# Patient Record
Sex: Male | Born: 1945 | Race: White | Hispanic: No | Marital: Married | Smoking: Never smoker
Health system: Southern US, Community
[De-identification: ages and names within clinical notes are randomized; demographics above are authoritative.]

## PROBLEM LIST (undated history)

## (undated) DIAGNOSIS — I10 Essential (primary) hypertension: Secondary | ICD-10-CM

## (undated) DIAGNOSIS — E785 Hyperlipidemia, unspecified: Secondary | ICD-10-CM

## (undated) HISTORY — DX: Hyperlipidemia, unspecified: E78.5

## (undated) HISTORY — DX: Essential (primary) hypertension: I10

---

## 2000-02-28 ENCOUNTER — Encounter (INDEPENDENT_AMBULATORY_CARE_PROVIDER_SITE_OTHER): Payer: Self-pay | Admitting: *Deleted

## 2000-02-28 ENCOUNTER — Ambulatory Visit (HOSPITAL_COMMUNITY): Admission: RE | Admit: 2000-02-28 | Discharge: 2000-02-28 | Payer: Self-pay | Admitting: Gastroenterology

## 2009-03-29 ENCOUNTER — Ambulatory Visit (HOSPITAL_BASED_OUTPATIENT_CLINIC_OR_DEPARTMENT_OTHER): Admission: RE | Admit: 2009-03-29 | Discharge: 2009-03-29 | Payer: Self-pay | Admitting: Urology

## 2010-10-10 ENCOUNTER — Other Ambulatory Visit: Payer: Self-pay | Admitting: Dermatology

## 2010-12-25 LAB — POCT HEMOGLOBIN-HEMACUE: Hemoglobin: 16.3 g/dL (ref 13.0–17.0)

## 2011-01-31 NOTE — Op Note (Signed)
NAME:  Randy Hudson, Randy Hudson NO.:  [id number]   MEDICAL RECORD NO.:  [id number]          PATIENT TYPE:  AMB   LOCATION:  [facility]                         FACILITY:  [facility]   PHYSICIAN:  Sigmund I. Patsi Sears, M.D.DATE OF BIRTH:  09/01/1946   DATE OF PROCEDURE:  03/29/2009  DATE OF DISCHARGE:                               OPERATIVE REPORT   PREOPERATIVE DIAGNOSIS:  Chronic left hydrocele.   POSTOPERATIVE DIAGNOSIS:  Chronic left hydrocele.   OPERATIONS:  Left hydrocelectomy.   SURGEON:  S. Patsi Sears, M.D.   ANESTHESIA:  General LMA.   Preparation:  After appropriate preanesthesia, the patient is brought to  the operating room, placed on the operating room in dorsal supine  position where general LMA anesthesia was induced.  He remained in this  position, where he received IV antibiotic, as well as a B & O  suppository.  The scrotum was shaved, prepped with Betadine solution and  draped in the usual fashion.   History:  This 65 year old male has a history of increasing left  hydrocele size, which is bothering him walking and causing general  discomfort.  He is now for left hydrocelectomy.   PROCEDURE:  A 5-cm left hemiscrotal incision is made.  Subcutaneous  tissue is dissected with the electrosurgical unit.  A large left  hydrocele is then delivered into the wound, and it is opened, and 125 mL  of clear straw-colored fluid is vacuum suctioned from the hydrocele sac.  The sac is opened, the wings of the sac are removed with the  electrosurgical unit, and each wing is oversewn with 3-0 Vicryl suture.  The two wings of the hydrocele sac are then sutured behind the spermatic  cord with 3-0 Vicryl suture.  No bleeding is noted.  The testicle is  replaced into the scrotum, the wound closed in two layers with running 3-  0 Vicryl suture.  Marcaine 0.25 plain is injected into the wound site,  the patient is given IV Toradol, awakened and taken to the recovery room  in good  condition.      Sigmund I. Patsi Sears, M.D.  Electronically Signed     SIT/MEDQ  D:  03/29/2009  T:  03/29/2009  Job:  [id number]

## 2011-02-03 NOTE — Procedures (Signed)
[facility]  Patient:    Randy Hudson, Randy Hudson                       MRN: [id number] Proc. Date: 02/28/00 Adm. Date:  [id number] Disc. Date: [id number] Attending:  Louie Bun CC:         Winn Jock. Charmian Muff, M.D.                           Procedure Report  PROCEDURE PERFORMED:  Colonoscopy.  ENDOSCOPIST:  Everardo All. Madilyn Fireman, M.D.  INDICATIONS FOR PROCEDURE:  Family history of colon cancer in a first degree relative.  DESCRIPTION OF PROCEDURE:  The patient was placed in the left lateral decubitus position and placed on the pulse monitor and continuous low flow oxygen delivered by nasal cannula.  He was sedated with 70 mg IV Demerol and 5 mg IV Versed.  The Olympus video colonoscope was inserted into the rectum and advanced to the cecum, confirmed by transillumination at McBurneys point and visualization of the ileocecal valve and appendiceal orifice.  The prep was good.  The cecum and ascending colon appeared normal with no masses, polyps, diverticula or other mucosal abnormalities.  Within the transverse colon was seen an 8 mm sessile polyp which was fulgurated by hot biopsy.  The remainder of the transverse, descending, sigmoid and rectum appeared normal with no further polyps, masses, diverticula or other mucosal abnormalities. The colonoscope was then withdrawn and the patient returned to the recovery room in stable condition.  He tolerated the procedure well and there were no immediate complications.  IMPRESSION:  Transverse colon polyp, otherwise normal colonoscopy.  PLAN:  Await histology for determination of interval for next surveillance colonoscopy. DD:  02/28/00 TD:  03/01/00 Job: [id number]

## 2014-05-04 DIAGNOSIS — I10 Essential (primary) hypertension: Secondary | ICD-10-CM | POA: Diagnosis not present

## 2014-05-04 DIAGNOSIS — Z125 Encounter for screening for malignant neoplasm of prostate: Secondary | ICD-10-CM | POA: Diagnosis not present

## 2014-05-04 DIAGNOSIS — E663 Overweight: Secondary | ICD-10-CM | POA: Diagnosis not present

## 2014-05-04 DIAGNOSIS — E7800 Pure hypercholesterolemia, unspecified: Secondary | ICD-10-CM | POA: Diagnosis not present

## 2014-05-04 DIAGNOSIS — M7660 Achilles tendinitis, unspecified leg: Secondary | ICD-10-CM | POA: Diagnosis not present

## 2014-07-01 DIAGNOSIS — Z23 Encounter for immunization: Secondary | ICD-10-CM | POA: Diagnosis not present

## 2014-11-10 DIAGNOSIS — I10 Essential (primary) hypertension: Secondary | ICD-10-CM | POA: Diagnosis not present

## 2014-11-10 DIAGNOSIS — Z800 Family history of malignant neoplasm of digestive organs: Secondary | ICD-10-CM | POA: Diagnosis not present

## 2014-11-10 DIAGNOSIS — Z23 Encounter for immunization: Secondary | ICD-10-CM | POA: Diagnosis not present

## 2014-11-10 DIAGNOSIS — E780 Pure hypercholesterolemia: Secondary | ICD-10-CM | POA: Diagnosis not present

## 2014-11-10 DIAGNOSIS — Z0000 Encounter for general adult medical examination without abnormal findings: Secondary | ICD-10-CM | POA: Diagnosis not present

## 2014-11-10 DIAGNOSIS — E663 Overweight: Secondary | ICD-10-CM | POA: Diagnosis not present

## 2014-11-10 DIAGNOSIS — Z79899 Other long term (current) drug therapy: Secondary | ICD-10-CM | POA: Diagnosis not present

## 2015-05-13 DIAGNOSIS — Z6829 Body mass index (BMI) 29.0-29.9, adult: Secondary | ICD-10-CM | POA: Diagnosis not present

## 2015-05-13 DIAGNOSIS — Z23 Encounter for immunization: Secondary | ICD-10-CM | POA: Diagnosis not present

## 2015-05-13 DIAGNOSIS — Z79899 Other long term (current) drug therapy: Secondary | ICD-10-CM | POA: Diagnosis not present

## 2015-05-13 DIAGNOSIS — I10 Essential (primary) hypertension: Secondary | ICD-10-CM | POA: Diagnosis not present

## 2015-05-13 DIAGNOSIS — E663 Overweight: Secondary | ICD-10-CM | POA: Diagnosis not present

## 2015-05-13 DIAGNOSIS — Z800 Family history of malignant neoplasm of digestive organs: Secondary | ICD-10-CM | POA: Diagnosis not present

## 2015-05-13 DIAGNOSIS — E780 Pure hypercholesterolemia: Secondary | ICD-10-CM | POA: Diagnosis not present

## 2015-08-06 DIAGNOSIS — M6208 Separation of muscle (nontraumatic), other site: Secondary | ICD-10-CM | POA: Diagnosis not present

## 2015-11-26 DIAGNOSIS — Z0000 Encounter for general adult medical examination without abnormal findings: Secondary | ICD-10-CM | POA: Diagnosis not present

## 2015-11-26 DIAGNOSIS — I10 Essential (primary) hypertension: Secondary | ICD-10-CM | POA: Diagnosis not present

## 2015-11-26 DIAGNOSIS — M545 Low back pain: Secondary | ICD-10-CM | POA: Diagnosis not present

## 2015-11-26 DIAGNOSIS — E663 Overweight: Secondary | ICD-10-CM | POA: Diagnosis not present

## 2015-11-26 DIAGNOSIS — E7800 Pure hypercholesterolemia, unspecified: Secondary | ICD-10-CM | POA: Diagnosis not present

## 2015-11-26 DIAGNOSIS — Z209 Contact with and (suspected) exposure to unspecified communicable disease: Secondary | ICD-10-CM | POA: Diagnosis not present

## 2015-11-26 DIAGNOSIS — Z800 Family history of malignant neoplasm of digestive organs: Secondary | ICD-10-CM | POA: Diagnosis not present

## 2015-11-26 DIAGNOSIS — Z125 Encounter for screening for malignant neoplasm of prostate: Secondary | ICD-10-CM | POA: Diagnosis not present

## 2016-05-29 DIAGNOSIS — L570 Actinic keratosis: Secondary | ICD-10-CM | POA: Diagnosis not present

## 2016-05-29 DIAGNOSIS — Z85828 Personal history of other malignant neoplasm of skin: Secondary | ICD-10-CM | POA: Diagnosis not present

## 2016-05-29 DIAGNOSIS — D485 Neoplasm of uncertain behavior of skin: Secondary | ICD-10-CM | POA: Diagnosis not present

## 2016-05-30 DIAGNOSIS — D2239 Melanocytic nevi of other parts of face: Secondary | ICD-10-CM | POA: Diagnosis not present

## 2016-05-30 DIAGNOSIS — L570 Actinic keratosis: Secondary | ICD-10-CM | POA: Diagnosis not present

## 2016-05-30 DIAGNOSIS — C44311 Basal cell carcinoma of skin of nose: Secondary | ICD-10-CM | POA: Diagnosis not present

## 2016-06-05 DIAGNOSIS — Z23 Encounter for immunization: Secondary | ICD-10-CM | POA: Diagnosis not present

## 2016-08-15 DIAGNOSIS — C44311 Basal cell carcinoma of skin of nose: Secondary | ICD-10-CM | POA: Diagnosis not present

## 2016-09-06 DIAGNOSIS — Z86018 Personal history of other benign neoplasm: Secondary | ICD-10-CM | POA: Diagnosis not present

## 2016-09-06 DIAGNOSIS — L821 Other seborrheic keratosis: Secondary | ICD-10-CM | POA: Diagnosis not present

## 2016-09-06 DIAGNOSIS — D225 Melanocytic nevi of trunk: Secondary | ICD-10-CM | POA: Diagnosis not present

## 2016-09-06 DIAGNOSIS — L570 Actinic keratosis: Secondary | ICD-10-CM | POA: Diagnosis not present

## 2016-09-06 DIAGNOSIS — Z85828 Personal history of other malignant neoplasm of skin: Secondary | ICD-10-CM | POA: Diagnosis not present

## 2016-09-06 DIAGNOSIS — Z23 Encounter for immunization: Secondary | ICD-10-CM | POA: Diagnosis not present

## 2016-09-13 DIAGNOSIS — M79605 Pain in left leg: Secondary | ICD-10-CM | POA: Diagnosis not present

## 2016-09-13 DIAGNOSIS — M79604 Pain in right leg: Secondary | ICD-10-CM | POA: Diagnosis not present

## 2016-09-25 DIAGNOSIS — H903 Sensorineural hearing loss, bilateral: Secondary | ICD-10-CM | POA: Diagnosis not present

## 2016-12-06 DIAGNOSIS — H9113 Presbycusis, bilateral: Secondary | ICD-10-CM | POA: Diagnosis not present

## 2016-12-06 DIAGNOSIS — I10 Essential (primary) hypertension: Secondary | ICD-10-CM | POA: Diagnosis not present

## 2016-12-06 DIAGNOSIS — Z6828 Body mass index (BMI) 28.0-28.9, adult: Secondary | ICD-10-CM | POA: Diagnosis not present

## 2016-12-06 DIAGNOSIS — Z0000 Encounter for general adult medical examination without abnormal findings: Secondary | ICD-10-CM | POA: Diagnosis not present

## 2016-12-06 DIAGNOSIS — E7800 Pure hypercholesterolemia, unspecified: Secondary | ICD-10-CM | POA: Diagnosis not present

## 2016-12-06 DIAGNOSIS — E559 Vitamin D deficiency, unspecified: Secondary | ICD-10-CM | POA: Diagnosis not present

## 2016-12-06 DIAGNOSIS — Z800 Family history of malignant neoplasm of digestive organs: Secondary | ICD-10-CM | POA: Diagnosis not present

## 2016-12-06 DIAGNOSIS — E663 Overweight: Secondary | ICD-10-CM | POA: Diagnosis not present

## 2017-06-05 DIAGNOSIS — Z23 Encounter for immunization: Secondary | ICD-10-CM | POA: Diagnosis not present

## 2017-09-04 DIAGNOSIS — D225 Melanocytic nevi of trunk: Secondary | ICD-10-CM | POA: Diagnosis not present

## 2017-09-04 DIAGNOSIS — L821 Other seborrheic keratosis: Secondary | ICD-10-CM | POA: Diagnosis not present

## 2017-09-04 DIAGNOSIS — Z85828 Personal history of other malignant neoplasm of skin: Secondary | ICD-10-CM | POA: Diagnosis not present

## 2017-09-04 DIAGNOSIS — L820 Inflamed seborrheic keratosis: Secondary | ICD-10-CM | POA: Diagnosis not present

## 2017-09-04 DIAGNOSIS — Z23 Encounter for immunization: Secondary | ICD-10-CM | POA: Diagnosis not present

## 2017-09-04 DIAGNOSIS — Z86018 Personal history of other benign neoplasm: Secondary | ICD-10-CM | POA: Diagnosis not present

## 2017-09-04 DIAGNOSIS — L570 Actinic keratosis: Secondary | ICD-10-CM | POA: Diagnosis not present

## 2017-10-09 DIAGNOSIS — H35363 Drusen (degenerative) of macula, bilateral: Secondary | ICD-10-CM | POA: Diagnosis not present

## 2017-11-30 DIAGNOSIS — H35363 Drusen (degenerative) of macula, bilateral: Secondary | ICD-10-CM | POA: Diagnosis not present

## 2017-12-11 DIAGNOSIS — H9113 Presbycusis, bilateral: Secondary | ICD-10-CM | POA: Diagnosis not present

## 2017-12-11 DIAGNOSIS — Z0000 Encounter for general adult medical examination without abnormal findings: Secondary | ICD-10-CM | POA: Diagnosis not present

## 2017-12-11 DIAGNOSIS — E7800 Pure hypercholesterolemia, unspecified: Secondary | ICD-10-CM | POA: Diagnosis not present

## 2017-12-11 DIAGNOSIS — Z800 Family history of malignant neoplasm of digestive organs: Secondary | ICD-10-CM | POA: Diagnosis not present

## 2017-12-11 DIAGNOSIS — Z125 Encounter for screening for malignant neoplasm of prostate: Secondary | ICD-10-CM | POA: Diagnosis not present

## 2017-12-11 DIAGNOSIS — Z0001 Encounter for general adult medical examination with abnormal findings: Secondary | ICD-10-CM | POA: Diagnosis not present

## 2017-12-11 DIAGNOSIS — I10 Essential (primary) hypertension: Secondary | ICD-10-CM | POA: Diagnosis not present

## 2017-12-11 DIAGNOSIS — E663 Overweight: Secondary | ICD-10-CM | POA: Diagnosis not present

## 2017-12-11 DIAGNOSIS — Z1389 Encounter for screening for other disorder: Secondary | ICD-10-CM | POA: Diagnosis not present

## 2017-12-11 DIAGNOSIS — E559 Vitamin D deficiency, unspecified: Secondary | ICD-10-CM | POA: Diagnosis not present

## 2017-12-26 DIAGNOSIS — H35351 Cystoid macular degeneration, right eye: Secondary | ICD-10-CM | POA: Diagnosis not present

## 2018-01-21 DIAGNOSIS — Z0000 Encounter for general adult medical examination without abnormal findings: Secondary | ICD-10-CM | POA: Diagnosis not present

## 2018-01-21 DIAGNOSIS — R972 Elevated prostate specific antigen [PSA]: Secondary | ICD-10-CM | POA: Diagnosis not present

## 2018-01-21 DIAGNOSIS — E7800 Pure hypercholesterolemia, unspecified: Secondary | ICD-10-CM | POA: Diagnosis not present

## 2018-01-29 DIAGNOSIS — L300 Nummular dermatitis: Secondary | ICD-10-CM | POA: Diagnosis not present

## 2018-02-06 DIAGNOSIS — H35363 Drusen (degenerative) of macula, bilateral: Secondary | ICD-10-CM | POA: Diagnosis not present

## 2018-02-06 DIAGNOSIS — H35351 Cystoid macular degeneration, right eye: Secondary | ICD-10-CM | POA: Diagnosis not present

## 2018-02-14 DIAGNOSIS — E7800 Pure hypercholesterolemia, unspecified: Secondary | ICD-10-CM | POA: Diagnosis not present

## 2018-02-14 DIAGNOSIS — Z0000 Encounter for general adult medical examination without abnormal findings: Secondary | ICD-10-CM | POA: Diagnosis not present

## 2018-02-14 DIAGNOSIS — R972 Elevated prostate specific antigen [PSA]: Secondary | ICD-10-CM | POA: Diagnosis not present

## 2018-03-05 DIAGNOSIS — H34831 Tributary (branch) retinal vein occlusion, right eye, with macular edema: Secondary | ICD-10-CM | POA: Diagnosis not present

## 2018-03-05 DIAGNOSIS — H3561 Retinal hemorrhage, right eye: Secondary | ICD-10-CM | POA: Diagnosis not present

## 2018-03-05 DIAGNOSIS — H353122 Nonexudative age-related macular degeneration, left eye, intermediate dry stage: Secondary | ICD-10-CM | POA: Diagnosis not present

## 2018-03-05 DIAGNOSIS — H353111 Nonexudative age-related macular degeneration, right eye, early dry stage: Secondary | ICD-10-CM | POA: Diagnosis not present

## 2018-03-26 DIAGNOSIS — H34831 Tributary (branch) retinal vein occlusion, right eye, with macular edema: Secondary | ICD-10-CM | POA: Diagnosis not present

## 2018-04-30 DIAGNOSIS — H353122 Nonexudative age-related macular degeneration, left eye, intermediate dry stage: Secondary | ICD-10-CM | POA: Diagnosis not present

## 2018-04-30 DIAGNOSIS — H353111 Nonexudative age-related macular degeneration, right eye, early dry stage: Secondary | ICD-10-CM | POA: Diagnosis not present

## 2018-04-30 DIAGNOSIS — H33193 Other retinoschisis and retinal cysts, bilateral: Secondary | ICD-10-CM | POA: Diagnosis not present

## 2018-04-30 DIAGNOSIS — H34831 Tributary (branch) retinal vein occlusion, right eye, with macular edema: Secondary | ICD-10-CM | POA: Diagnosis not present

## 2018-06-04 DIAGNOSIS — H353111 Nonexudative age-related macular degeneration, right eye, early dry stage: Secondary | ICD-10-CM | POA: Diagnosis not present

## 2018-06-04 DIAGNOSIS — H34831 Tributary (branch) retinal vein occlusion, right eye, with macular edema: Secondary | ICD-10-CM | POA: Diagnosis not present

## 2018-06-04 DIAGNOSIS — H33193 Other retinoschisis and retinal cysts, bilateral: Secondary | ICD-10-CM | POA: Diagnosis not present

## 2018-06-04 DIAGNOSIS — H353122 Nonexudative age-related macular degeneration, left eye, intermediate dry stage: Secondary | ICD-10-CM | POA: Diagnosis not present

## 2018-06-18 DIAGNOSIS — Z23 Encounter for immunization: Secondary | ICD-10-CM | POA: Diagnosis not present

## 2018-07-16 DIAGNOSIS — H33193 Other retinoschisis and retinal cysts, bilateral: Secondary | ICD-10-CM | POA: Diagnosis not present

## 2018-07-16 DIAGNOSIS — H34831 Tributary (branch) retinal vein occlusion, right eye, with macular edema: Secondary | ICD-10-CM | POA: Diagnosis not present

## 2018-07-16 DIAGNOSIS — H353122 Nonexudative age-related macular degeneration, left eye, intermediate dry stage: Secondary | ICD-10-CM | POA: Diagnosis not present

## 2018-07-16 DIAGNOSIS — H353111 Nonexudative age-related macular degeneration, right eye, early dry stage: Secondary | ICD-10-CM | POA: Diagnosis not present

## 2018-09-04 DIAGNOSIS — Z85828 Personal history of other malignant neoplasm of skin: Secondary | ICD-10-CM | POA: Diagnosis not present

## 2018-09-04 DIAGNOSIS — L723 Sebaceous cyst: Secondary | ICD-10-CM | POA: Diagnosis not present

## 2018-09-04 DIAGNOSIS — L821 Other seborrheic keratosis: Secondary | ICD-10-CM | POA: Diagnosis not present

## 2018-09-04 DIAGNOSIS — L739 Follicular disorder, unspecified: Secondary | ICD-10-CM | POA: Diagnosis not present

## 2018-09-04 DIAGNOSIS — D225 Melanocytic nevi of trunk: Secondary | ICD-10-CM | POA: Diagnosis not present

## 2018-09-04 DIAGNOSIS — Z86018 Personal history of other benign neoplasm: Secondary | ICD-10-CM | POA: Diagnosis not present

## 2018-09-04 DIAGNOSIS — Z23 Encounter for immunization: Secondary | ICD-10-CM | POA: Diagnosis not present

## 2018-09-04 DIAGNOSIS — L570 Actinic keratosis: Secondary | ICD-10-CM | POA: Diagnosis not present

## 2018-09-20 DIAGNOSIS — H353122 Nonexudative age-related macular degeneration, left eye, intermediate dry stage: Secondary | ICD-10-CM | POA: Diagnosis not present

## 2018-09-20 DIAGNOSIS — H34831 Tributary (branch) retinal vein occlusion, right eye, with macular edema: Secondary | ICD-10-CM | POA: Diagnosis not present

## 2018-09-20 DIAGNOSIS — H33193 Other retinoschisis and retinal cysts, bilateral: Secondary | ICD-10-CM | POA: Diagnosis not present

## 2018-09-20 DIAGNOSIS — H353111 Nonexudative age-related macular degeneration, right eye, early dry stage: Secondary | ICD-10-CM | POA: Diagnosis not present

## 2018-11-06 DIAGNOSIS — H35033 Hypertensive retinopathy, bilateral: Secondary | ICD-10-CM | POA: Diagnosis not present

## 2018-11-06 DIAGNOSIS — H35363 Drusen (degenerative) of macula, bilateral: Secondary | ICD-10-CM | POA: Diagnosis not present

## 2018-11-29 DIAGNOSIS — Z86010 Personal history of colonic polyps: Secondary | ICD-10-CM | POA: Diagnosis not present

## 2018-11-29 DIAGNOSIS — D123 Benign neoplasm of transverse colon: Secondary | ICD-10-CM | POA: Diagnosis not present

## 2018-11-29 DIAGNOSIS — D125 Benign neoplasm of sigmoid colon: Secondary | ICD-10-CM | POA: Diagnosis not present

## 2018-11-29 DIAGNOSIS — Z800 Family history of malignant neoplasm of digestive organs: Secondary | ICD-10-CM | POA: Diagnosis not present

## 2018-11-29 DIAGNOSIS — K5730 Diverticulosis of large intestine without perforation or abscess without bleeding: Secondary | ICD-10-CM | POA: Diagnosis not present

## 2018-11-29 DIAGNOSIS — K621 Rectal polyp: Secondary | ICD-10-CM | POA: Diagnosis not present

## 2018-12-03 DIAGNOSIS — D125 Benign neoplasm of sigmoid colon: Secondary | ICD-10-CM | POA: Diagnosis not present

## 2018-12-03 DIAGNOSIS — D123 Benign neoplasm of transverse colon: Secondary | ICD-10-CM | POA: Diagnosis not present

## 2018-12-03 DIAGNOSIS — K621 Rectal polyp: Secondary | ICD-10-CM | POA: Diagnosis not present

## 2018-12-11 DIAGNOSIS — H34831 Tributary (branch) retinal vein occlusion, right eye, with macular edema: Secondary | ICD-10-CM | POA: Diagnosis not present

## 2018-12-31 DIAGNOSIS — E7800 Pure hypercholesterolemia, unspecified: Secondary | ICD-10-CM | POA: Diagnosis not present

## 2018-12-31 DIAGNOSIS — I10 Essential (primary) hypertension: Secondary | ICD-10-CM | POA: Diagnosis not present

## 2018-12-31 DIAGNOSIS — Z0000 Encounter for general adult medical examination without abnormal findings: Secondary | ICD-10-CM | POA: Diagnosis not present

## 2018-12-31 DIAGNOSIS — H9113 Presbycusis, bilateral: Secondary | ICD-10-CM | POA: Diagnosis not present

## 2018-12-31 DIAGNOSIS — Z86010 Personal history of colonic polyps: Secondary | ICD-10-CM | POA: Diagnosis not present

## 2018-12-31 DIAGNOSIS — E559 Vitamin D deficiency, unspecified: Secondary | ICD-10-CM | POA: Diagnosis not present

## 2019-02-28 DIAGNOSIS — H34831 Tributary (branch) retinal vein occlusion, right eye, with macular edema: Secondary | ICD-10-CM | POA: Diagnosis not present

## 2019-05-21 DIAGNOSIS — H33193 Other retinoschisis and retinal cysts, bilateral: Secondary | ICD-10-CM | POA: Diagnosis not present

## 2019-05-21 DIAGNOSIS — H34831 Tributary (branch) retinal vein occlusion, right eye, with macular edema: Secondary | ICD-10-CM | POA: Diagnosis not present

## 2019-05-21 DIAGNOSIS — H353111 Nonexudative age-related macular degeneration, right eye, early dry stage: Secondary | ICD-10-CM | POA: Diagnosis not present

## 2019-05-21 DIAGNOSIS — H353122 Nonexudative age-related macular degeneration, left eye, intermediate dry stage: Secondary | ICD-10-CM | POA: Diagnosis not present

## 2019-05-27 DIAGNOSIS — E559 Vitamin D deficiency, unspecified: Secondary | ICD-10-CM | POA: Diagnosis not present

## 2019-05-27 DIAGNOSIS — M7662 Achilles tendinitis, left leg: Secondary | ICD-10-CM | POA: Diagnosis not present

## 2019-05-27 DIAGNOSIS — I10 Essential (primary) hypertension: Secondary | ICD-10-CM | POA: Diagnosis not present

## 2019-05-27 DIAGNOSIS — Z86010 Personal history of colonic polyps: Secondary | ICD-10-CM | POA: Diagnosis not present

## 2019-05-27 DIAGNOSIS — M7661 Achilles tendinitis, right leg: Secondary | ICD-10-CM | POA: Diagnosis not present

## 2019-05-27 DIAGNOSIS — E663 Overweight: Secondary | ICD-10-CM | POA: Diagnosis not present

## 2019-05-27 DIAGNOSIS — Z6828 Body mass index (BMI) 28.0-28.9, adult: Secondary | ICD-10-CM | POA: Diagnosis not present

## 2019-05-27 DIAGNOSIS — E7800 Pure hypercholesterolemia, unspecified: Secondary | ICD-10-CM | POA: Diagnosis not present

## 2019-05-27 DIAGNOSIS — Z23 Encounter for immunization: Secondary | ICD-10-CM | POA: Diagnosis not present

## 2019-06-20 DIAGNOSIS — C44619 Basal cell carcinoma of skin of left upper limb, including shoulder: Secondary | ICD-10-CM | POA: Diagnosis not present

## 2019-06-20 DIAGNOSIS — D485 Neoplasm of uncertain behavior of skin: Secondary | ICD-10-CM | POA: Diagnosis not present

## 2019-06-20 DIAGNOSIS — Z23 Encounter for immunization: Secondary | ICD-10-CM | POA: Diagnosis not present

## 2019-08-07 DIAGNOSIS — C44619 Basal cell carcinoma of skin of left upper limb, including shoulder: Secondary | ICD-10-CM | POA: Diagnosis not present

## 2019-08-27 DIAGNOSIS — H348312 Tributary (branch) retinal vein occlusion, right eye, stable: Secondary | ICD-10-CM | POA: Diagnosis not present

## 2019-08-27 DIAGNOSIS — H353122 Nonexudative age-related macular degeneration, left eye, intermediate dry stage: Secondary | ICD-10-CM | POA: Diagnosis not present

## 2019-08-27 DIAGNOSIS — H353111 Nonexudative age-related macular degeneration, right eye, early dry stage: Secondary | ICD-10-CM | POA: Diagnosis not present

## 2019-08-27 DIAGNOSIS — H33193 Other retinoschisis and retinal cysts, bilateral: Secondary | ICD-10-CM | POA: Diagnosis not present

## 2019-09-24 DIAGNOSIS — H33193 Other retinoschisis and retinal cysts, bilateral: Secondary | ICD-10-CM | POA: Diagnosis not present

## 2019-09-24 DIAGNOSIS — H353122 Nonexudative age-related macular degeneration, left eye, intermediate dry stage: Secondary | ICD-10-CM | POA: Diagnosis not present

## 2019-09-24 DIAGNOSIS — H34831 Tributary (branch) retinal vein occlusion, right eye, with macular edema: Secondary | ICD-10-CM | POA: Diagnosis not present

## 2019-09-24 DIAGNOSIS — H353111 Nonexudative age-related macular degeneration, right eye, early dry stage: Secondary | ICD-10-CM | POA: Diagnosis not present

## 2019-10-17 ENCOUNTER — Ambulatory Visit: Payer: Self-pay

## 2019-10-25 ENCOUNTER — Ambulatory Visit: Payer: Self-pay

## 2019-11-07 ENCOUNTER — Ambulatory Visit: Payer: Self-pay

## 2019-11-19 DIAGNOSIS — H353111 Nonexudative age-related macular degeneration, right eye, early dry stage: Secondary | ICD-10-CM | POA: Diagnosis not present

## 2019-11-19 DIAGNOSIS — H353122 Nonexudative age-related macular degeneration, left eye, intermediate dry stage: Secondary | ICD-10-CM | POA: Diagnosis not present

## 2019-11-19 DIAGNOSIS — H34831 Tributary (branch) retinal vein occlusion, right eye, with macular edema: Secondary | ICD-10-CM | POA: Diagnosis not present

## 2020-01-06 DIAGNOSIS — Z6829 Body mass index (BMI) 29.0-29.9, adult: Secondary | ICD-10-CM | POA: Diagnosis not present

## 2020-01-06 DIAGNOSIS — E663 Overweight: Secondary | ICD-10-CM | POA: Diagnosis not present

## 2020-01-06 DIAGNOSIS — E7800 Pure hypercholesterolemia, unspecified: Secondary | ICD-10-CM | POA: Diagnosis not present

## 2020-01-06 DIAGNOSIS — I10 Essential (primary) hypertension: Secondary | ICD-10-CM | POA: Diagnosis not present

## 2020-01-06 DIAGNOSIS — Z86010 Personal history of colonic polyps: Secondary | ICD-10-CM | POA: Diagnosis not present

## 2020-01-06 DIAGNOSIS — E559 Vitamin D deficiency, unspecified: Secondary | ICD-10-CM | POA: Diagnosis not present

## 2020-01-06 DIAGNOSIS — Z800 Family history of malignant neoplasm of digestive organs: Secondary | ICD-10-CM | POA: Diagnosis not present

## 2020-01-06 DIAGNOSIS — H9113 Presbycusis, bilateral: Secondary | ICD-10-CM | POA: Diagnosis not present

## 2020-01-06 DIAGNOSIS — Z125 Encounter for screening for malignant neoplasm of prostate: Secondary | ICD-10-CM | POA: Diagnosis not present

## 2020-01-06 DIAGNOSIS — Z0000 Encounter for general adult medical examination without abnormal findings: Secondary | ICD-10-CM | POA: Diagnosis not present

## 2020-01-14 DIAGNOSIS — H353122 Nonexudative age-related macular degeneration, left eye, intermediate dry stage: Secondary | ICD-10-CM | POA: Diagnosis not present

## 2020-01-14 DIAGNOSIS — H353111 Nonexudative age-related macular degeneration, right eye, early dry stage: Secondary | ICD-10-CM | POA: Diagnosis not present

## 2020-01-14 DIAGNOSIS — H33193 Other retinoschisis and retinal cysts, bilateral: Secondary | ICD-10-CM | POA: Diagnosis not present

## 2020-01-14 DIAGNOSIS — H34831 Tributary (branch) retinal vein occlusion, right eye, with macular edema: Secondary | ICD-10-CM | POA: Diagnosis not present

## 2020-05-26 DIAGNOSIS — H353122 Nonexudative age-related macular degeneration, left eye, intermediate dry stage: Secondary | ICD-10-CM | POA: Diagnosis not present

## 2020-05-26 DIAGNOSIS — H353111 Nonexudative age-related macular degeneration, right eye, early dry stage: Secondary | ICD-10-CM | POA: Diagnosis not present

## 2020-05-26 DIAGNOSIS — H34831 Tributary (branch) retinal vein occlusion, right eye, with macular edema: Secondary | ICD-10-CM | POA: Diagnosis not present

## 2020-05-26 DIAGNOSIS — H33193 Other retinoschisis and retinal cysts, bilateral: Secondary | ICD-10-CM | POA: Diagnosis not present

## 2020-05-31 DIAGNOSIS — Z23 Encounter for immunization: Secondary | ICD-10-CM | POA: Diagnosis not present

## 2020-07-05 DIAGNOSIS — I10 Essential (primary) hypertension: Secondary | ICD-10-CM | POA: Diagnosis not present

## 2020-07-05 DIAGNOSIS — E663 Overweight: Secondary | ICD-10-CM | POA: Diagnosis not present

## 2020-07-05 DIAGNOSIS — Z6828 Body mass index (BMI) 28.0-28.9, adult: Secondary | ICD-10-CM | POA: Diagnosis not present

## 2020-07-05 DIAGNOSIS — E7800 Pure hypercholesterolemia, unspecified: Secondary | ICD-10-CM | POA: Diagnosis not present

## 2020-07-16 DIAGNOSIS — Z23 Encounter for immunization: Secondary | ICD-10-CM | POA: Diagnosis not present

## 2020-08-18 DIAGNOSIS — H3561 Retinal hemorrhage, right eye: Secondary | ICD-10-CM | POA: Diagnosis not present

## 2020-08-18 DIAGNOSIS — H353122 Nonexudative age-related macular degeneration, left eye, intermediate dry stage: Secondary | ICD-10-CM | POA: Diagnosis not present

## 2020-08-18 DIAGNOSIS — H353111 Nonexudative age-related macular degeneration, right eye, early dry stage: Secondary | ICD-10-CM | POA: Diagnosis not present

## 2020-08-18 DIAGNOSIS — H34831 Tributary (branch) retinal vein occlusion, right eye, with macular edema: Secondary | ICD-10-CM | POA: Diagnosis not present

## 2020-10-18 DIAGNOSIS — R002 Palpitations: Secondary | ICD-10-CM | POA: Diagnosis not present

## 2020-10-18 DIAGNOSIS — R0789 Other chest pain: Secondary | ICD-10-CM | POA: Diagnosis not present

## 2020-12-21 DIAGNOSIS — H353122 Nonexudative age-related macular degeneration, left eye, intermediate dry stage: Secondary | ICD-10-CM | POA: Diagnosis not present

## 2020-12-21 DIAGNOSIS — H3561 Retinal hemorrhage, right eye: Secondary | ICD-10-CM | POA: Diagnosis not present

## 2020-12-21 DIAGNOSIS — H353111 Nonexudative age-related macular degeneration, right eye, early dry stage: Secondary | ICD-10-CM | POA: Diagnosis not present

## 2020-12-21 DIAGNOSIS — H34831 Tributary (branch) retinal vein occlusion, right eye, with macular edema: Secondary | ICD-10-CM | POA: Diagnosis not present

## 2020-12-28 DIAGNOSIS — H34831 Tributary (branch) retinal vein occlusion, right eye, with macular edema: Secondary | ICD-10-CM | POA: Diagnosis not present

## 2021-01-05 DIAGNOSIS — E7800 Pure hypercholesterolemia, unspecified: Secondary | ICD-10-CM | POA: Diagnosis not present

## 2021-01-05 DIAGNOSIS — Z6829 Body mass index (BMI) 29.0-29.9, adult: Secondary | ICD-10-CM | POA: Diagnosis not present

## 2021-01-05 DIAGNOSIS — R002 Palpitations: Secondary | ICD-10-CM | POA: Diagnosis not present

## 2021-01-05 DIAGNOSIS — I10 Essential (primary) hypertension: Secondary | ICD-10-CM | POA: Diagnosis not present

## 2021-01-11 DIAGNOSIS — Z1389 Encounter for screening for other disorder: Secondary | ICD-10-CM | POA: Diagnosis not present

## 2021-01-11 DIAGNOSIS — Z0000 Encounter for general adult medical examination without abnormal findings: Secondary | ICD-10-CM | POA: Diagnosis not present

## 2021-01-23 DIAGNOSIS — Z23 Encounter for immunization: Secondary | ICD-10-CM | POA: Diagnosis not present

## 2021-03-23 ENCOUNTER — Encounter: Payer: Self-pay | Admitting: Interventional Cardiology

## 2021-03-23 ENCOUNTER — Other Ambulatory Visit: Payer: Self-pay

## 2021-03-23 ENCOUNTER — Telehealth: Payer: Self-pay | Admitting: *Deleted

## 2021-03-23 ENCOUNTER — Ambulatory Visit (INDEPENDENT_AMBULATORY_CARE_PROVIDER_SITE_OTHER): Payer: Medicare Other | Admitting: Interventional Cardiology

## 2021-03-23 VITALS — BP 150/88 | HR 88 | Ht 65.0 in | Wt 177.2 lb

## 2021-03-23 DIAGNOSIS — R072 Precordial pain: Secondary | ICD-10-CM

## 2021-03-23 DIAGNOSIS — I10 Essential (primary) hypertension: Secondary | ICD-10-CM | POA: Diagnosis not present

## 2021-03-23 DIAGNOSIS — R002 Palpitations: Secondary | ICD-10-CM | POA: Diagnosis not present

## 2021-03-23 DIAGNOSIS — R0683 Snoring: Secondary | ICD-10-CM

## 2021-03-23 DIAGNOSIS — Z8249 Family history of ischemic heart disease and other diseases of the circulatory system: Secondary | ICD-10-CM | POA: Diagnosis not present

## 2021-03-23 DIAGNOSIS — E782 Mixed hyperlipidemia: Secondary | ICD-10-CM

## 2021-03-23 DIAGNOSIS — G4730 Sleep apnea, unspecified: Secondary | ICD-10-CM

## 2021-03-23 LAB — BASIC METABOLIC PANEL
BUN/Creatinine Ratio: 12 (ref 10–24)
BUN: 13 mg/dL (ref 8–27)
CO2: 24 mmol/L (ref 20–29)
Calcium: 9.6 mg/dL (ref 8.6–10.2)
Chloride: 102 mmol/L (ref 96–106)
Creatinine, Ser: 1.05 mg/dL (ref 0.76–1.27)
Glucose: 93 mg/dL (ref 65–99)
Potassium: 4.1 mmol/L (ref 3.5–5.2)
Sodium: 140 mmol/L (ref 134–144)
eGFR: 75 mL/min/{1.73_m2} (ref >59)

## 2021-03-23 MED ORDER — METOPROLOL TARTRATE 100 MG PO TABS: ORAL_TABLET | ORAL | 0 refills | Status: DC

## 2021-03-23 NOTE — Telephone Encounter (Signed)
-----   Message from Lauralee Evener, [place] sent at 03/23/2021 10:52 AM EDT ----- Regarding: FW: sleep study Ok o schedule sleep study. No PA is required for Medicare. ----- Message ----- From: Thompson Grayer, RN Sent: 03/23/2021  10:11 AM EDT To: Lauralee Evener, CMA, Freada Bergeron, CMA, # Subject: sleep study                                    Dr Irish Lack has ordered a sleep study for this patient.  Sleep study to be done after patient has Cardiac CT scan. Thanks, Fraser Din

## 2021-03-23 NOTE — H&P (View-Only) (Signed)
Cardiology Office Note   Date:  03/23/2021   ID:  Randy Hudson, DOB [birth year], MRN [id number]  PCP:  Pcp, No    No chief complaint on file.  palpitations  Wt Readings from Last 3 Encounters:  03/23/21 177 lb 3.2 oz (80.4 kg)       History of Present Illness: Randy Hudson is a 75 y.o. male who is being seen today for the evaluation of palpitations, FH early CAD at the request of Kathyrn Lass, MD.   Son had CABG 2 months ago at [facility]. Father had AVR.  Older brother had CABG at age 73. THey were all smokers.  Patient does not smoke.  Denies : Dizziness. Leg edema. Nitroglycerin use. Orthopnea. Paroxysmal nocturnal dyspnea. Shortness of breath. Syncope.    Walks, does yard work, shovels mulch, shoveled snow- no cardiac sx.  Does not walk a lot.  Chest pains-some sharp and quick; sometimes dull and lasting a few mintues. Palpitations worse if he lies on his left side- interrupts sleep.  BP higher at the MDs office.  Worked at [employer] as an Chief Financial Officer.    No past medical history on file.     Current Outpatient Medications  Medication Sig Dispense Refill   atorvastatin (LIPITOR) 80 MG tablet Take 80 mg by mouth daily.     cholecalciferol (VITAMIN D3) 25 MCG (1000 UNIT) tablet Take 2,000 Units by mouth daily.     Multiple Vitamins-Minerals (PRESERVISION AREDS 2+MULTI VIT PO) Take by mouth.     tobramycin (TOBREX) 0.3 % ophthalmic solution PLEASE SEE ATTACHED FOR DETAILED DIRECTIONS     triamterene-hydrochlorothiazide (DYAZIDE) 37.5-25 MG capsule Take 1 capsule by mouth every morning.     No current facility-administered medications for this visit.    Allergies:   Patient has no allergy information on record.    Social History:  The patient  reports that he has never smoked. He has never used smokeless tobacco.   Family History:  The patient's family history includes Colon cancer in his father; Heart disease in his father.    ROS:  Please see the history of  present illness.   Otherwise, review of systems are positive for palpitations, macular edema- gets injections.   All other systems are reviewed and negative.    PHYSICAL EXAM: VS:  BP (!) 150/88   Pulse 88   Ht 5\' 5"  (1.651 m)   Wt 177 lb 3.2 oz (80.4 kg)   SpO2 98%   BMI 29.49 kg/m  , BMI Body mass index is 29.49 kg/m. GEN: Well nourished, well developed, in no acute distress HEENT: normal Neck: no JVD, carotid bruits, or masses Cardiac: RRR; no murmurs, rubs, or gallops,no edema  Respiratory:  clear to auscultation bilaterally, normal work of breathing GI: soft, nontender, nondistended, + BS MS: no deformity or atrophy Skin: warm and dry, no rash Neuro:  Strength and sensation are intact Psych: euthymic mood, full affect   EKG:   The ekg ordered today demonstrates NSR, no ST changes   Recent Labs: No results found for requested labs within last 8760 hours.   Lipid Panel No results found for: CHOL, TRIG, HDL, CHOLHDL, VLDL, LDLCALC, LDLDIRECT   Other studies Reviewed: Additional studies/ records that were reviewed today with results demonstrating: LDL 133 in 4/22.   ASSESSMENT AND PLAN:  Chest pain: Worse in certain position.  Has had some DOE as well.  Will pan to eval for ischemia. Plan for CAT coronaries. HTN: High  readings in the MDs office.  120/80 at home.  Low-salt diet.  Avoid processed foods. Palpitations: He has an apple watch.  He will obtain strips from the apple watch and send them to [place].  No problems with palpitations during the day.  No syncope.  I think this is likely low risk. Hyperlipidemia: If calcium score is elevated, will likely need to intensify his statin and try to get LDL around 70. Family h/o CAD: Multiple people in the family with history of bypass.  They have been smokers.  He has not been. Observed apnea: Wife is noted that he stops breathing during sleep.  We will plan for sleep study after CTA coronaries.   Current medicines are  reviewed at length with the patient today.  The patient concerns regarding his medicines were addressed.  The following changes have been made:  No change  Labs/ tests ordered today include:  No orders of the defined types were placed in this encounter.   Recommend 150 minutes/week of aerobic exercise Low fat, low carb, high fiber diet recommended  Disposition:   FU in for CTA   Signed, [name], MD  03/23/2021 9:26 AM    [address], [place], [place]  [address] Phone: [phone]; Fax: [phone]

## 2021-03-23 NOTE — Patient Instructions (Addendum)
Medication Instructions:  Your physician recommends that you continue on your current medications as directed. Please refer to the Current Medication list given to you today.  *If you need a refill on your cardiac medications before your next appointment, please call your pharmacy*   Lab Work: Lab work to be done today--BMP  If you have labs (blood work) drawn today and your tests are completely normal, you will receive your results only by: [place] (if you have MyChart) OR A paper copy in the mail If you have any lab test that is abnormal or we need to change your treatment, we will call you to review the results.   Testing/Procedures: Your physician has requested that you have cardiac CT. Cardiac computed tomography (CT) is a painless test that uses an x-ray machine to take clear, detailed pictures of your heart. For further information please visit [email or web address]. Please follow instruction sheet as given.  Your physician has recommended that you have a sleep study. This test records several body functions during sleep, including: brain activity, eye movement, oxygen and carbon dioxide blood levels, heart rate and rhythm, breathing rate and rhythm, the flow of air through your mouth and nose, snoring, body muscle movements, and chest and belly movement. To be done after Cardiac CT Scan   Follow-Up: At [facility], you and your health needs are our priority.  As part of our continuing mission to provide you with exceptional heart care, we have created designated Provider Care Teams.  These Care Teams include your primary Cardiologist (physician) and Advanced Practice Providers (APPs -  Physician Assistants and Nurse Practitioners) who all work together to provide you with the care you need, when you need it.  We recommend signing up for the patient portal called "MyChart".  Sign up information is provided on this After Visit Summary.  MyChart is used to connect with patients  for Virtual Visits (Telemedicine).  Patients are able to view lab/test results, encounter notes, upcoming appointments, etc.  Non-urgent messages can be sent to your provider as well.   To learn more about what you can do with MyChart, go to [email or web address].    Your next appointment:   12 month(s)  The format for your next appointment:   In Person  Provider:   You may see [name], MD or one of the following Advanced Practice Providers on your designated Care Team:   Melina Copa, PA-C Ermalinda Barrios, PA-C   Other Instructions  If having palpitations use watch to record and send in through my chart    Your cardiac CT will be scheduled at one of the below locations:   [facility] [address] [place], [place] [address] [phone]  [place] [address] [place], [place] [address] [phone]  If scheduled at [facility], please arrive at the [facility] main entrance (entrance A) of [facility] 30 minutes prior to test start time. Proceed to the [facility] Radiology Department (first floor) to check-in and test prep.  If scheduled at [facility], please arrive 15 mins early for check-in and test prep.  Please follow these instructions carefully (unless otherwise directed):  Hold all erectile dysfunction medications at least 3 days (72 hrs) prior to test.  On the Night Before the Test: Be sure to Drink plenty of water. Do not consume any caffeinated/decaffeinated beverages or chocolate 12 hours prior to your test. Do not take any  antihistamines 12 hours prior to your test.   On the Day of the Test: Drink plenty of water until 1 hour prior to the test. Do not eat any food 4 hours prior to the test. You may take your regular medications prior to the test.  Take metoprolol (Lopressor) two hours prior to test. Dose is 100 mg HOLD  Furosemide/Hydrochlorothiazide morning of the test.    After the Test: Drink plenty of water. After receiving IV contrast, you may experience a mild flushed feeling. This is normal. On occasion, you may experience a mild rash up to 24 hours after the test. This is not dangerous. If this occurs, you can take Benadryl 25 mg and increase your fluid intake. If you experience trouble breathing, this can be serious. If it is severe call 911 IMMEDIATELY. If it is mild, please call our office. If you take any of these medications: Glipizide/Metformin, Avandament, Glucavance, please do not take 48 hours after completing test unless otherwise instructed.   Once we have confirmed authorization from your insurance company, we will call you to set up a date and time for your test. Based on how quickly your insurance processes prior authorizations requests, please allow up to 4 weeks to be contacted for scheduling your Cardiac CT appointment. Be advised that routine Cardiac CT appointments could be scheduled as many as 8 weeks after your provider has ordered it.  For non-scheduling related questions, please contact the cardiac imaging nurse navigator should you have any questions/concerns: Marchia Bond, Cardiac Imaging Nurse Navigator Gordy Clement, Cardiac Imaging Nurse Navigator [facility] Direct Office Dial: [phone]   For scheduling needs, including cancellations and rescheduling, please call [place], [phone].

## 2021-03-23 NOTE — Progress Notes (Signed)
Cardiology Office Note   Date:  03/23/2021   ID:  ECHO ALLSBROOK, DOB [birth year], MRN [id number]  PCP:  Pcp, No    No chief complaint on file.  palpitations  Wt Readings from Last 3 Encounters:  03/23/21 177 lb 3.2 oz (80.4 kg)       History of Present Illness: Randy Hudson is a 75 y.o. male who is being seen today for the evaluation of palpitations, FH early CAD at the request of Kathyrn Lass, MD.   Son had CABG 2 months ago at [facility]. Father had AVR.  Older brother had CABG at age 16. THey were all smokers.  Patient does not smoke.  Denies : Dizziness. Leg edema. Nitroglycerin use. Orthopnea. Paroxysmal nocturnal dyspnea. Shortness of breath. Syncope.    Walks, does yard work, shovels mulch, shoveled snow- no cardiac sx.  Does not walk a lot.  Chest pains-some sharp and quick; sometimes dull and lasting a few mintues. Palpitations worse if he lies on his left side- interrupts sleep.  BP higher at the MDs office.  Worked at [employer] as an Chief Financial Officer.    No past medical history on file.     Current Outpatient Medications  Medication Sig Dispense Refill   atorvastatin (LIPITOR) 80 MG tablet Take 80 mg by mouth daily.     cholecalciferol (VITAMIN D3) 25 MCG (1000 UNIT) tablet Take 2,000 Units by mouth daily.     Multiple Vitamins-Minerals (PRESERVISION AREDS 2+MULTI VIT PO) Take by mouth.     tobramycin (TOBREX) 0.3 % ophthalmic solution PLEASE SEE ATTACHED FOR DETAILED DIRECTIONS     triamterene-hydrochlorothiazide (DYAZIDE) 37.5-25 MG capsule Take 1 capsule by mouth every morning.     No current facility-administered medications for this visit.    Allergies:   Patient has no allergy information on record.    Social History:  The patient  reports that he has never smoked. He has never used smokeless tobacco.   Family History:  The patient's family history includes Colon cancer in his father; Heart disease in his father.    ROS:  Please see the history of  present illness.   Otherwise, review of systems are positive for palpitations, macular edema- gets injections.   All other systems are reviewed and negative.    PHYSICAL EXAM: VS:  BP (!) 150/88   Pulse 88   Ht 5\' 5"  (1.651 m)   Wt 177 lb 3.2 oz (80.4 kg)   SpO2 98%   BMI 29.49 kg/m  , BMI Body mass index is 29.49 kg/m. GEN: Well nourished, well developed, in no acute distress HEENT: normal Neck: no JVD, carotid bruits, or masses Cardiac: RRR; no murmurs, rubs, or gallops,no edema  Respiratory:  clear to auscultation bilaterally, normal work of breathing GI: soft, nontender, nondistended, + BS MS: no deformity or atrophy Skin: warm and dry, no rash Neuro:  Strength and sensation are intact Psych: euthymic mood, full affect   EKG:   The ekg ordered today demonstrates NSR, no ST changes   Recent Labs: No results found for requested labs within last 8760 hours.   Lipid Panel No results found for: CHOL, TRIG, HDL, CHOLHDL, VLDL, LDLCALC, LDLDIRECT   Other studies Reviewed: Additional studies/ records that were reviewed today with results demonstrating: LDL 133 in 4/22.   ASSESSMENT AND PLAN:  Chest pain: Worse in certain position.  Has had some DOE as well.  Will pan to eval for ischemia. Plan for CAT coronaries. HTN: High  readings in the MDs office.  120/80 at home.  Low-salt diet.  Avoid processed foods. Palpitations: He has an apple watch.  He will obtain strips from the apple watch and send them to [place].  No problems with palpitations during the day.  No syncope.  I think this is likely low risk. Hyperlipidemia: If calcium score is elevated, will likely need to intensify his statin and try to get LDL around 70. Family h/o CAD: Multiple people in the family with history of bypass.  They have been smokers.  He has not been. Observed apnea: Wife is noted that he stops breathing during sleep.  We will plan for sleep study after CTA coronaries.   Current medicines are  reviewed at length with the patient today.  The patient concerns regarding his medicines were addressed.  The following changes have been made:  No change  Labs/ tests ordered today include:  No orders of the defined types were placed in this encounter.   Recommend 150 minutes/week of aerobic exercise Low fat, low carb, high fiber diet recommended  Disposition:   FU in for CTA   Signed, [name], MD  03/23/2021 9:26 AM    [address] [place], [place], [place]  [address] Phone: [phone]; Fax: [phone]

## 2021-03-25 NOTE — Telephone Encounter (Addendum)
Patient is scheduled for lab study on 06/15/21. Patient understands her/his sleep study will be done at [facility] sleep lab. Patient understands she/he will receive a sleep packet in a week or so. Patient understands to call if she/he does not receive the sleep packet in a timely manner. Patient agrees with treatment and thanked me for call.

## 2021-03-29 DIAGNOSIS — H34831 Tributary (branch) retinal vein occlusion, right eye, with macular edema: Secondary | ICD-10-CM | POA: Diagnosis not present

## 2021-03-29 DIAGNOSIS — H353111 Nonexudative age-related macular degeneration, right eye, early dry stage: Secondary | ICD-10-CM | POA: Diagnosis not present

## 2021-03-29 DIAGNOSIS — H353122 Nonexudative age-related macular degeneration, left eye, intermediate dry stage: Secondary | ICD-10-CM | POA: Diagnosis not present

## 2021-03-29 DIAGNOSIS — H3561 Retinal hemorrhage, right eye: Secondary | ICD-10-CM | POA: Diagnosis not present

## 2021-03-30 ENCOUNTER — Telehealth (HOSPITAL_COMMUNITY): Payer: Self-pay | Admitting: Emergency Medicine

## 2021-03-30 NOTE — Telephone Encounter (Signed)
Reaching out to patient to offer assistance regarding upcoming cardiac imaging study; pt verbalizes understanding of appt date/time, parking situation and where to check in, pre-test NPO status and medications ordered, and verified current allergies; name and call back number provided for further questions should they arise Randy Bond RN Navigator Cardiac Imaging [facility] [phone] office [phone] cell  Denies iv issues Denies claustro Holding diuretic 100mg  metoprolol tartrate 2 hr prior

## 2021-04-01 ENCOUNTER — Ambulatory Visit (HOSPITAL_COMMUNITY)
Admission: RE | Admit: 2021-04-01 | Discharge: 2021-04-01 | Disposition: A | Discharge status: Home / Self Care | Payer: Medicare Other | Source: Ambulatory Visit | Attending: Interventional Cardiology | Admitting: Interventional Cardiology

## 2021-04-01 ENCOUNTER — Other Ambulatory Visit: Payer: Self-pay

## 2021-04-01 ENCOUNTER — Other Ambulatory Visit (HOSPITAL_COMMUNITY): Payer: Self-pay | Admitting: Emergency Medicine

## 2021-04-01 DIAGNOSIS — I2510 Atherosclerotic heart disease of native coronary artery without angina pectoris: Secondary | ICD-10-CM | POA: Diagnosis not present

## 2021-04-01 DIAGNOSIS — R931 Abnormal findings on diagnostic imaging of heart and coronary circulation: Secondary | ICD-10-CM

## 2021-04-01 DIAGNOSIS — R079 Chest pain, unspecified: Secondary | ICD-10-CM

## 2021-04-01 DIAGNOSIS — R072 Precordial pain: Secondary | ICD-10-CM

## 2021-04-01 IMAGING — CT CT HEART MORP W/ CTA COR W/ SCORE W/ CA W/CM &/OR W/O CM
4 of 7 series · 8 of 20 positions shown, 9 images · IV contrast (APPLIED)
Comparison: None.
COMPARISON: None.

Addendum:
EXAM:
OVER-READ INTERPRETATION  CT CHEST

The following report is an over-read performed by radiologist Dr.
Jina Mosier [REDACTED] on 04/01/2021. This
over-read does not include interpretation of cardiac or coronary
anatomy or pathology. The coronary calcium score/coronary CTA
interpretation by the cardiologist is attached.
CLINICAL DATA: 74 year old male with family history of CAD, with
chest pain.
Cardiac/Coronary  CTA
TECHNIQUE: The patient was scanned on a [employer] Force scanner.

[Series 6: best diast 74 % · axial · 0.39mm/px · z∈[+1216,+1252]mm · 2 of 268 slices shown]
[im 90/268  vessel]
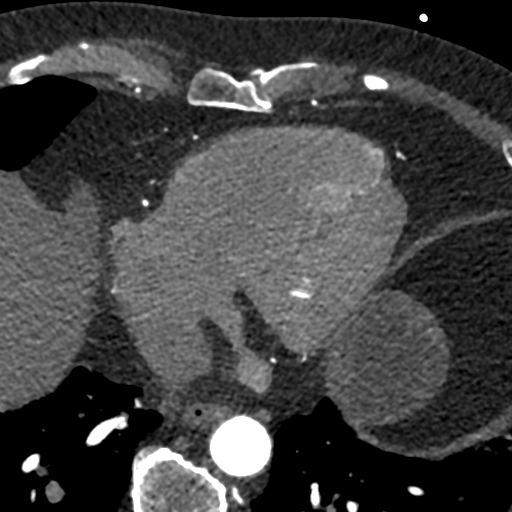
[im 179/268  vessel]
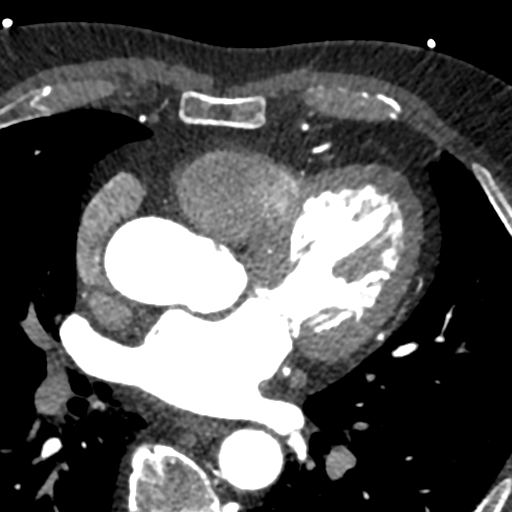

[Series 7: best syst · axial · 0.39mm/px · z∈[+1216,+1252]mm · 2 of 268 slices shown, 3 images]
[im 90/268  vessel]
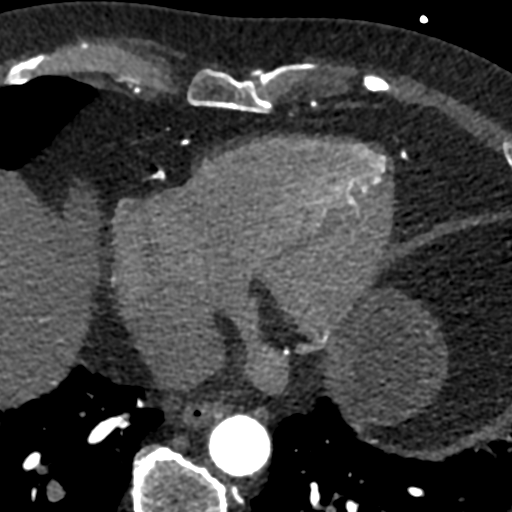
[im 90/268  lung]
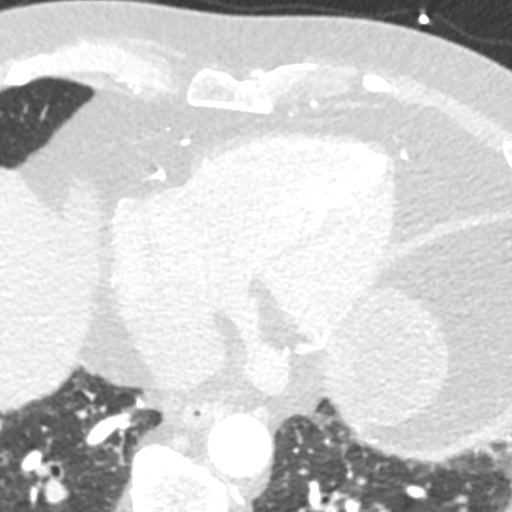
[im 179/268  vessel]
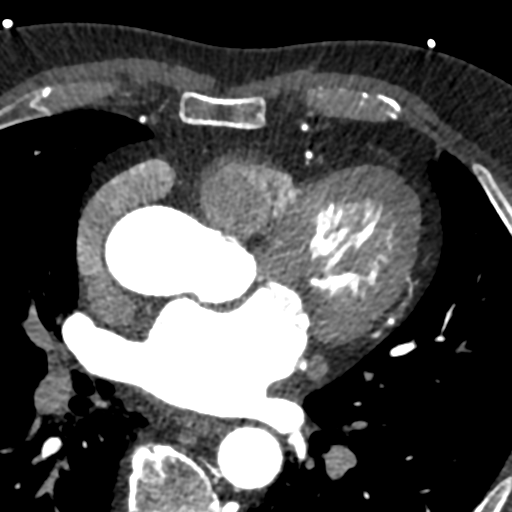

[Series 9: ts diast sharp 74 % · axial · 0.39mm/px · z∈[+1216,+1252]mm · 2 of 268 slices shown]
[im 90/268  lung]
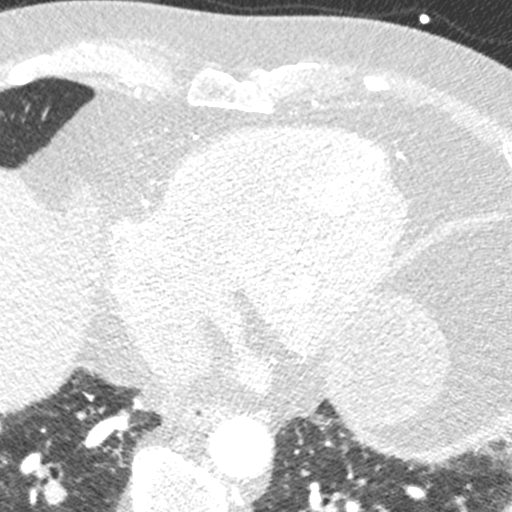
[im 179/268  lung]
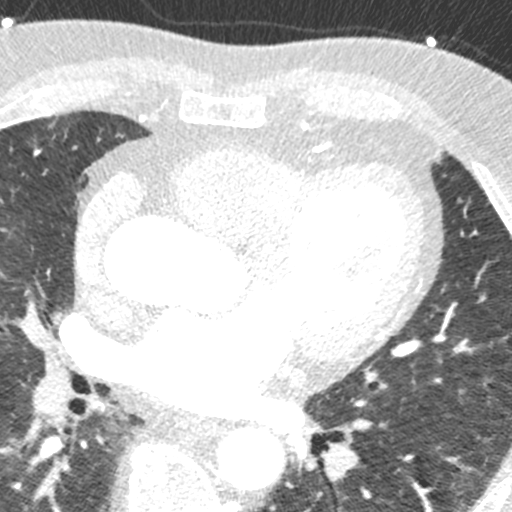

[Series 10: ts syst sharp · axial · 0.39mm/px · z∈[+1216,+1252]mm · 2 of 268 slices shown]
[im 90/268  lung]
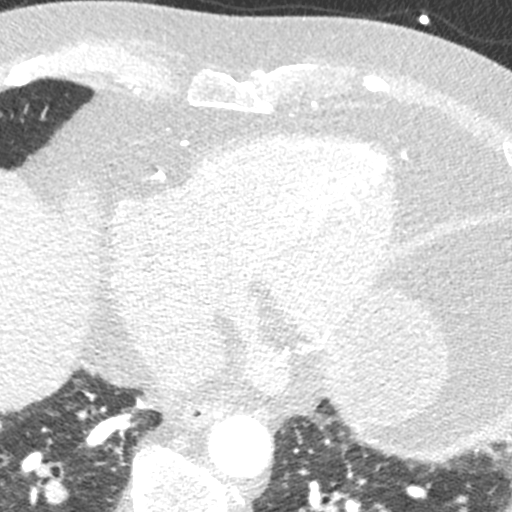
[im 179/268  lung]
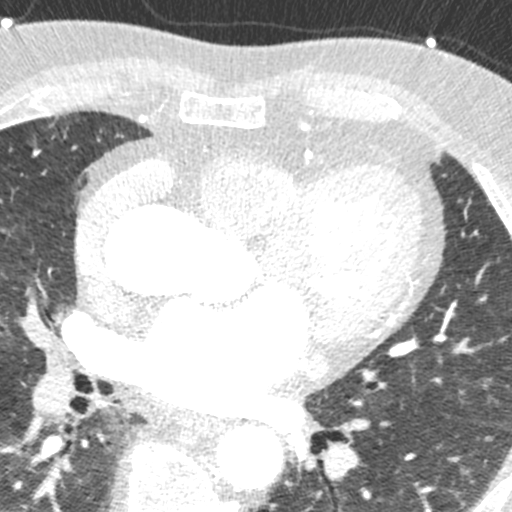

[8 of 20 positions shown; findings below may reference images not displayed]

FINDINGS: Atherosclerotic calcifications in the thoracic aorta. Within the
visualized portions of the thorax there are no suspicious appearing
pulmonary nodules or masses, there is no acute consolidative
airspace disease, no pleural effusions, no pneumothorax and no
lymphadenopathy. Visualized portions of the upper abdomen are
unremarkable. There are no aggressive appearing lytic or blastic
lesions noted in the visualized portions of the skeleton.
IMPRESSION: 1.  Aortic Atherosclerosis (L90P9-IJK.K).
FINDINGS: A 100 kV prospective scan was triggered in the descending thoracic
aorta at 111 HU's. Axial non-contrast 3 mm slices were carried out
through the heart. The data set was analyzed on a dedicated work
station and scored using the Agatson method. Gantry rotation speed
was 250 msecs and collimation was .6 mm. Beta blockade and 0.8 mg of
sl NTG was given. The 3D data set was reconstructed in 5% intervals
of the 67-82 % of the R-R cycle. Diastolic phases were analyzed on a
dedicated work station using MPR, MIP and VRT modes. The patient
received 80 cc of contrast.

Aorta:  Normal size.  Aortic atherosclerosis.  No dissection.

Aortic Valve:  Trileaflet.  No calcifications.

Coronary Arteries:  Normal coronary origin.  Right dominance.

RCA is a large dominant artery that gives rise to PDA and PLA. There
is scattered calcified plaque with possible severe stenosis (75%)
mid segment. The proximal small caliber PDA appears occluded and
reconstitutes 6 mm after ostium.

Left main is a large artery that gives rise to LAD and LCX arteries.
Mild distal LM calcified plaque, 0-24%.

LAD is a large vessel that has diffuse dense calcified plaque.
Proximal and distal to the second diagonal bifurcation there is
dark, soft plaque with what appears to be significant luminal
stenosis (75-99%). Sending for FFR analysis.

LCX is a non-dominant artery that gives rise to one large OM1 branch
and 3 smaller distal OM branches. There is scattered calcified
plaque, 0-24% stenosis.

There is a small caliber ramus branch with calcified stenosis at a
primary bifurcation. Difficult to determine severity of stenosis.

Calcium score:

LM: 60

LAD: 661

LCX: 209

RCA: 316

Total: 3888, 86 percentile.

Other findings:

Normal pulmonary vein drainage into the left atrium.

Small PFO.

Normal left atrial appendage without a thrombus.

Normal size of the pulmonary artery.

Please see radiology report for non cardiac findings.
IMPRESSION: 1. Coronary calcium score of 3888. This was 86 percentile for age
and sex matched control.

2. Normal coronary origin with right dominance.

3. Multivessel CAD with possible flow limiting stenosis in LAD, RCA
as well as small caliber ramus branch. Occluded proximal PDA.
Sending for FFR.

4. Recommend cardiac catheterization. Accurate measurement of
luminal diameters are limited by dense calcified plaque.

*** End of Addendum ***
EXAM:
OVER-READ INTERPRETATION  CT CHEST

The following report is an over-read performed by radiologist Dr.
Jina Mosier [REDACTED] on 04/01/2021. This
over-read does not include interpretation of cardiac or coronary
anatomy or pathology. The coronary calcium score/coronary CTA
interpretation by the cardiologist is attached.
FINDINGS: Atherosclerotic calcifications in the thoracic aorta. Within the
visualized portions of the thorax there are no suspicious appearing
pulmonary nodules or masses, there is no acute consolidative
airspace disease, no pleural effusions, no pneumothorax and no
lymphadenopathy. Visualized portions of the upper abdomen are
unremarkable. There are no aggressive appearing lytic or blastic
lesions noted in the visualized portions of the skeleton.
IMPRESSION: 1.  Aortic Atherosclerosis (L90P9-IJK.K).

## 2021-04-01 MED ORDER — NITROGLYCERIN 0.4 MG SL SUBL
SUBLINGUAL_TABLET | SUBLINGUAL | Status: AC
  Filled 2021-04-01: qty 2

## 2021-04-01 MED ORDER — IOHEXOL 350 MG/ML SOLN
95.0000 mL | Freq: Once | INTRAVENOUS | Status: AC | PRN
  Administered 2021-04-01: 95 mL via INTRAVENOUS

## 2021-04-01 MED ORDER — NITROGLYCERIN 0.4 MG SL SUBL: 0.8000 mg | SUBLINGUAL_TABLET | Freq: Once | SUBLINGUAL | Status: DC

## 2021-04-05 ENCOUNTER — Other Ambulatory Visit (HOSPITAL_COMMUNITY): Payer: Self-pay | Admitting: Emergency Medicine

## 2021-04-05 ENCOUNTER — Ambulatory Visit (HOSPITAL_COMMUNITY)
Admission: RE | Admit: 2021-04-05 | Discharge: 2021-04-05 | Disposition: A | Discharge status: Home / Self Care | Payer: Medicare Other | Source: Ambulatory Visit | Attending: Cardiology | Admitting: Cardiology

## 2021-04-05 DIAGNOSIS — R931 Abnormal findings on diagnostic imaging of heart and coronary circulation: Secondary | ICD-10-CM | POA: Diagnosis not present

## 2021-04-06 ENCOUNTER — Telehealth: Payer: Self-pay | Admitting: *Deleted

## 2021-04-06 ENCOUNTER — Encounter: Payer: Self-pay | Admitting: *Deleted

## 2021-04-06 DIAGNOSIS — R931 Abnormal findings on diagnostic imaging of heart and coronary circulation: Secondary | ICD-10-CM

## 2021-04-06 DIAGNOSIS — Z01812 Encounter for preprocedural laboratory examination: Secondary | ICD-10-CM

## 2021-04-06 DIAGNOSIS — I2510 Atherosclerotic heart disease of native coronary artery without angina pectoris: Secondary | ICD-10-CM | POA: Diagnosis not present

## 2021-04-06 DIAGNOSIS — R079 Chest pain, unspecified: Secondary | ICD-10-CM

## 2021-04-06 MED ORDER — ASPIRIN EC 81 MG PO TBEC: 81.0000 mg | DELAYED_RELEASE_TABLET | Freq: Every day | ORAL | 3 refills | Status: DC

## 2021-04-06 MED ORDER — NITROGLYCERIN 0.4 MG SL SUBL: 0.4000 mg | SUBLINGUAL_TABLET | SUBLINGUAL | 6 refills | Status: DC | PRN

## 2021-04-06 NOTE — Telephone Encounter (Signed)
-----   Message from Jettie Booze, MD sent at 04/04/2021  5:12 PM EDT ----- I discussed the CT scan results with him by phone.  He is agreeable to cath.  Please set it up on one of my days.  OK to call in SL NTG as well.    The patient understands that risks include but are not limited to stroke (1 in 1000), death (1 in 72), kidney failure [usually temporary] (1 in 500), bleeding (1 in 200), allergic reaction [possibly serious] (1 in 200), and agrees to proceed.

## 2021-04-06 NOTE — Telephone Encounter (Signed)
I verbally went over cath instructions with patient and sent instructions through my chart.  Prescription for NTG sent to [facility] at [facility].  Instructions on how to use reviewed with patient.  Patient reports he is currently taking ASA 81 mg daily.

## 2021-04-06 NOTE — Telephone Encounter (Signed)
I spoke with patient and scheduled cath for July 26 at 9:00.  Patient will come in for lab work on July 22,2022

## 2021-04-08 ENCOUNTER — Other Ambulatory Visit: Payer: Medicare Other | Admitting: *Deleted

## 2021-04-08 ENCOUNTER — Other Ambulatory Visit: Payer: Self-pay

## 2021-04-08 DIAGNOSIS — R931 Abnormal findings on diagnostic imaging of heart and coronary circulation: Secondary | ICD-10-CM

## 2021-04-08 DIAGNOSIS — Z01812 Encounter for preprocedural laboratory examination: Secondary | ICD-10-CM

## 2021-04-08 DIAGNOSIS — R079 Chest pain, unspecified: Secondary | ICD-10-CM | POA: Diagnosis not present

## 2021-04-08 LAB — BASIC METABOLIC PANEL
BUN/Creatinine Ratio: 14 (ref 10–24)
BUN: 15 mg/dL (ref 8–27)
CO2: 25 mmol/L (ref 20–29)
Calcium: 9.2 mg/dL (ref 8.6–10.2)
Chloride: 98 mmol/L (ref 96–106)
Creatinine, Ser: 1.08 mg/dL (ref 0.76–1.27)
Glucose: 135 mg/dL — ABNORMAL HIGH (ref 65–99)
Potassium: 3.9 mmol/L (ref 3.5–5.2)
Sodium: 140 mmol/L (ref 134–144)
eGFR: 73 mL/min/{1.73_m2} (ref >59)

## 2021-04-08 LAB — CBC
Hematocrit: 47.7 % (ref 37.5–51.0)
Hemoglobin: 16.3 g/dL (ref 13.0–17.7)
MCH: 30 pg (ref 26.6–33.0)
MCHC: 34.2 g/dL (ref 31.5–35.7)
MCV: 88 fL (ref 79–97)
Platelets: 322 10*3/uL (ref 150–450)
RBC: 5.44 x10E6/uL (ref 4.14–5.80)
RDW: 13.1 % (ref 11.6–15.4)
WBC: 7.7 10*3/uL (ref 3.4–10.8)

## 2021-04-11 ENCOUNTER — Telehealth: Payer: Self-pay | Admitting: *Deleted

## 2021-04-11 NOTE — Telephone Encounter (Signed)
Pt contacted pre-catheterization scheduled at [facility] for: Tuesday April 12, 2021 9 AM Verified arrival time and place: [facility]) at: 7 AM   No solid food after midnight prior to cath, clear liquids until 5 AM day of procedure.  Hold: Triamterene/HCT- AM of procedure  Except hold medications AM meds can be  taken pre-cath with sips of water including: aspirin 81 mg   Confirmed patient has responsible adult to drive home post procedure and be with patient first 24 hours after arriving home: yes  You are allowed ONE visitor in the waiting room during the time you are at the hospital for your procedure. Both you and your visitor must wear a mask once you enter the hospital.   Patient reports does not currently have any symptoms concerning for COVID-19 and no household members with COVID-19 like illness.      Reviewed procedure/mask/visitor instructions with patient.

## 2021-04-12 ENCOUNTER — Encounter (HOSPITAL_COMMUNITY): Payer: Self-pay | Admitting: Interventional Cardiology

## 2021-04-12 ENCOUNTER — Other Ambulatory Visit: Payer: Self-pay

## 2021-04-12 ENCOUNTER — Ambulatory Visit (HOSPITAL_COMMUNITY)
Admission: RE | Admit: 2021-04-12 | Discharge: 2021-04-13 | Disposition: A | Discharge status: Home / Self Care | Payer: Medicare Other | Attending: Interventional Cardiology | Admitting: Interventional Cardiology

## 2021-04-12 ENCOUNTER — Encounter: Payer: Self-pay | Admitting: *Deleted

## 2021-04-12 ENCOUNTER — Encounter (HOSPITAL_COMMUNITY)
Admission: RE | Disposition: A | Discharge status: Home / Self Care | Payer: Self-pay | Source: Home / Self Care | Attending: Interventional Cardiology

## 2021-04-12 DIAGNOSIS — R0681 Apnea, not elsewhere classified: Secondary | ICD-10-CM | POA: Diagnosis not present

## 2021-04-12 DIAGNOSIS — I2510 Atherosclerotic heart disease of native coronary artery without angina pectoris: Secondary | ICD-10-CM | POA: Diagnosis present

## 2021-04-12 DIAGNOSIS — Z79899 Other long term (current) drug therapy: Secondary | ICD-10-CM | POA: Insufficient documentation

## 2021-04-12 DIAGNOSIS — I25118 Atherosclerotic heart disease of native coronary artery with other forms of angina pectoris: Secondary | ICD-10-CM

## 2021-04-12 DIAGNOSIS — Z8249 Family history of ischemic heart disease and other diseases of the circulatory system: Secondary | ICD-10-CM | POA: Diagnosis not present

## 2021-04-12 DIAGNOSIS — E785 Hyperlipidemia, unspecified: Secondary | ICD-10-CM

## 2021-04-12 DIAGNOSIS — R002 Palpitations: Secondary | ICD-10-CM | POA: Diagnosis not present

## 2021-04-12 DIAGNOSIS — Z955 Presence of coronary angioplasty implant and graft: Secondary | ICD-10-CM

## 2021-04-12 DIAGNOSIS — I10 Essential (primary) hypertension: Secondary | ICD-10-CM | POA: Insufficient documentation

## 2021-04-12 DIAGNOSIS — Z006 Encounter for examination for normal comparison and control in clinical research program: Secondary | ICD-10-CM

## 2021-04-12 DIAGNOSIS — E782 Mixed hyperlipidemia: Secondary | ICD-10-CM

## 2021-04-12 HISTORY — PX: CORONARY STENT INTERVENTION: CATH118234

## 2021-04-12 HISTORY — PX: LEFT HEART CATH AND CORONARY ANGIOGRAPHY: CATH118249

## 2021-04-12 LAB — POCT ACTIVATED CLOTTING TIME: Activated Clotting Time: 306 seconds

## 2021-04-12 SURGERY — LEFT HEART CATH AND CORONARY ANGIOGRAPHY
Anesthesia: LOCAL

## 2021-04-12 MED ORDER — HYDRALAZINE HCL 20 MG/ML IJ SOLN: 10.0000 mg | INTRAMUSCULAR | Status: AC | PRN

## 2021-04-12 MED ORDER — VERAPAMIL HCL 2.5 MG/ML IV SOLN
INTRAVENOUS | Status: DC | PRN
  Administered 2021-04-12: 10 mL via INTRA_ARTERIAL

## 2021-04-12 MED ORDER — SODIUM CHLORIDE 0.9 % IV SOLN: INTRAVENOUS | Status: AC

## 2021-04-12 MED ORDER — SODIUM CHLORIDE 0.9 % WEIGHT BASED INFUSION: 1.0000 mL/kg/h | INTRAVENOUS | Status: DC

## 2021-04-12 MED ORDER — LIDOCAINE HCL (PF) 1 % IJ SOLN
INTRAMUSCULAR | Status: AC
  Filled 2021-04-12: qty 30

## 2021-04-12 MED ORDER — SODIUM CHLORIDE 0.9% FLUSH
3.0000 mL | Freq: Two times a day (BID) | INTRAVENOUS | Status: DC
  Administered 2021-04-12: 3 mL via INTRAVENOUS

## 2021-04-12 MED ORDER — NITROGLYCERIN 0.4 MG SL SUBL: 0.4000 mg | SUBLINGUAL_TABLET | SUBLINGUAL | Status: DC | PRN

## 2021-04-12 MED ORDER — ATORVASTATIN CALCIUM 80 MG PO TABS
80.0000 mg | ORAL_TABLET | Freq: Every day | ORAL | Status: DC
  Administered 2021-04-13: 80 mg via ORAL
  Filled 2021-04-12: qty 1

## 2021-04-12 MED ORDER — LABETALOL HCL 5 MG/ML IV SOLN: 10.0000 mg | INTRAVENOUS | Status: AC | PRN

## 2021-04-12 MED ORDER — MIDAZOLAM HCL 2 MG/2ML IJ SOLN
INTRAMUSCULAR | Status: AC
  Filled 2021-04-12: qty 2

## 2021-04-12 MED ORDER — SODIUM CHLORIDE 0.9 % IV SOLN: 250.0000 mL | INTRAVENOUS | Status: DC | PRN

## 2021-04-12 MED ORDER — ONDANSETRON HCL 4 MG/2ML IJ SOLN: 4.0000 mg | Freq: Four times a day (QID) | INTRAMUSCULAR | Status: DC | PRN

## 2021-04-12 MED ORDER — SODIUM CHLORIDE 0.9 % WEIGHT BASED INFUSION
3.0000 mL/kg/h | INTRAVENOUS | Status: DC
  Administered 2021-04-12: 3 mL/kg/h via INTRAVENOUS

## 2021-04-12 MED ORDER — FENTANYL CITRATE (PF) 100 MCG/2ML IJ SOLN
INTRAMUSCULAR | Status: AC
  Filled 2021-04-12: qty 2

## 2021-04-12 MED ORDER — MIDAZOLAM HCL 2 MG/2ML IJ SOLN
INTRAMUSCULAR | Status: DC | PRN
  Administered 2021-04-12: 1 mg via INTRAVENOUS
  Administered 2021-04-12: 2 mg via INTRAVENOUS

## 2021-04-12 MED ORDER — TRIAMTERENE-HCTZ 37.5-25 MG PO TABS
1.0000 | ORAL_TABLET | Freq: Every morning | ORAL | Status: DC
  Administered 2021-04-13: 1 via ORAL
  Filled 2021-04-12: qty 1

## 2021-04-12 MED ORDER — VITAMIN D 25 MCG (1000 UNIT) PO TABS
2000.0000 [IU] | ORAL_TABLET | Freq: Every day | ORAL | Status: DC
  Administered 2021-04-13: 2000 [IU] via ORAL
  Filled 2021-04-12 (×2): qty 2

## 2021-04-12 MED ORDER — SODIUM CHLORIDE 0.9% FLUSH: 3.0000 mL | INTRAVENOUS | Status: DC | PRN

## 2021-04-12 MED ORDER — VERAPAMIL HCL 2.5 MG/ML IV SOLN
INTRAVENOUS | Status: AC
  Filled 2021-04-12: qty 2

## 2021-04-12 MED ORDER — HEPARIN (PORCINE) IN NACL 1000-0.9 UT/500ML-% IV SOLN
INTRAVENOUS | Status: AC
  Filled 2021-04-12: qty 500

## 2021-04-12 MED ORDER — CLOPIDOGREL BISULFATE 300 MG PO TABS
ORAL_TABLET | ORAL | Status: AC
  Filled 2021-04-12: qty 2

## 2021-04-12 MED ORDER — TOBRAMYCIN 0.3 % OP SOLN: 1.0000 [drp] | Freq: Every day | OPHTHALMIC | Status: DC | PRN

## 2021-04-12 MED ORDER — HEPARIN (PORCINE) IN NACL 1000-0.9 UT/500ML-% IV SOLN
INTRAVENOUS | Status: DC | PRN
  Administered 2021-04-12: 500 mL

## 2021-04-12 MED ORDER — ASPIRIN 81 MG PO CHEW: 81.0000 mg | CHEWABLE_TABLET | ORAL | Status: DC

## 2021-04-12 MED ORDER — IOHEXOL 350 MG/ML SOLN
INTRAVENOUS | Status: DC | PRN
  Administered 2021-04-12: 135 mL

## 2021-04-12 MED ORDER — ASPIRIN EC 81 MG PO TBEC
81.0000 mg | DELAYED_RELEASE_TABLET | Freq: Every day | ORAL | Status: DC
  Administered 2021-04-13: 81 mg via ORAL
  Filled 2021-04-12: qty 1

## 2021-04-12 MED ORDER — NITROGLYCERIN 1 MG/10 ML FOR IR/CATH LAB
INTRA_ARTERIAL | Status: AC
  Filled 2021-04-12: qty 10

## 2021-04-12 MED ORDER — LIDOCAINE HCL (PF) 1 % IJ SOLN
INTRAMUSCULAR | Status: DC | PRN
  Administered 2021-04-12: 2 mL

## 2021-04-12 MED ORDER — ACETAMINOPHEN 325 MG PO TABS: 650.0000 mg | ORAL_TABLET | ORAL | Status: DC | PRN

## 2021-04-12 MED ORDER — HEPARIN SODIUM (PORCINE) 1000 UNIT/ML IJ SOLN
INTRAMUSCULAR | Status: AC
  Filled 2021-04-12: qty 1

## 2021-04-12 MED ORDER — CLOPIDOGREL BISULFATE 300 MG PO TABS
ORAL_TABLET | ORAL | Status: DC | PRN
  Administered 2021-04-12: 600 mg via ORAL

## 2021-04-12 MED ORDER — CLOPIDOGREL BISULFATE 75 MG PO TABS
75.0000 mg | ORAL_TABLET | Freq: Every day | ORAL | Status: DC
  Administered 2021-04-13: 75 mg via ORAL
  Filled 2021-04-12: qty 1

## 2021-04-12 MED ORDER — NITROGLYCERIN 1 MG/10 ML FOR IR/CATH LAB
INTRA_ARTERIAL | Status: DC | PRN
  Administered 2021-04-12 (×2): 200 ug via INTRACORONARY
  Administered 2021-04-12: 300 ug via INTRA_ARTERIAL

## 2021-04-12 MED ORDER — ASPIRIN 81 MG PO CHEW: 81.0000 mg | CHEWABLE_TABLET | Freq: Every day | ORAL | Status: DC

## 2021-04-12 MED ORDER — FENTANYL CITRATE (PF) 100 MCG/2ML IJ SOLN
INTRAMUSCULAR | Status: DC | PRN
  Administered 2021-04-12: 25 ug via INTRAVENOUS
  Administered 2021-04-12: 50 ug via INTRAVENOUS
  Administered 2021-04-12: 25 ug via INTRAVENOUS

## 2021-04-12 MED ORDER — HEPARIN SODIUM (PORCINE) 1000 UNIT/ML IJ SOLN
INTRAMUSCULAR | Status: DC | PRN
  Administered 2021-04-12: 4000 [IU] via INTRAVENOUS
  Administered 2021-04-12: 6000 [IU] via INTRAVENOUS
  Administered 2021-04-12: 2000 [IU] via INTRAVENOUS

## 2021-04-12 SURGICAL SUPPLY — 19 items
BALLN SAPPHIRE 2.5X20 (BALLOONS) ×2
BALLN SAPPHIRE ~~LOC~~ 3.5X12 (BALLOONS) ×1 IMPLANT
BALLOON SAPPHIRE 2.5X20 (BALLOONS) IMPLANT
CATH 5FR JL3.5 JR4 ANG PIG MP (CATHETERS) ×1 IMPLANT
CATH LAUNCHER 6FR EBU3.5 (CATHETERS) ×1 IMPLANT
DEVICE RAD COMP TR BAND LRG (VASCULAR PRODUCTS) ×1 IMPLANT
GLIDESHEATH SLEND SS 6F .021 (SHEATH) ×1 IMPLANT
GUIDEWIRE INQWIRE 1.5J.035X260 (WIRE) IMPLANT
INQWIRE 1.5J .035X260CM (WIRE) ×2
KIT ENCORE 26 ADVANTAGE (KITS) ×1 IMPLANT
KIT HEART LEFT (KITS) ×2 IMPLANT
PACK CARDIAC CATHETERIZATION (CUSTOM PROCEDURE TRAY) ×2 IMPLANT
SHEATH PROBE COVER 6X72 (BAG) ×1 IMPLANT
STENT ONYX FRONTIER 3.0X26 (Permanent Stent) ×1 IMPLANT
TRANSDUCER W/STOPCOCK (MISCELLANEOUS) ×2 IMPLANT
TUBING CIL FLEX 10 FLL-RA (TUBING) ×2 IMPLANT
VALVE COPILOT STAT (MISCELLANEOUS) ×1 IMPLANT
WIRE ASAHI PROWATER 180CM (WIRE) ×1 IMPLANT
WIRE HI TORQ BMW 190CM (WIRE) ×1 IMPLANT

## 2021-04-12 NOTE — Plan of Care (Signed)

## 2021-04-12 NOTE — Research (Signed)
Identify Informed Consent   Subject Name: ITZAEL LIPTAK  Subject met inclusion and exclusion criteria.  The informed consent form, study requirements and expectations were reviewed with the subject and questions and concerns were addressed prior to the signing of the consent form.  The subject verbalized understanding of the trial requirements.  The subject agreed to participate in the Identify trial and signed the informed consent at 0730 on 12-Apr-2021.  The informed consent was obtained prior to performance of any protocol-specific procedures for the subject.  A copy of the signed informed consent was given to the subject and a copy was placed in the subject's medical record.   Jasmine Pang, RN BSN [place] Cardiovascular Research & Education Direct Line: [phone]

## 2021-04-12 NOTE — Interval H&P Note (Signed)
Cath Lab Visit (complete for each Cath Lab visit)  Clinical Evaluation Leading to the Procedure:   ACS: No.  Non-ACS:    Anginal Classification: CCS III  Anti-ischemic medical therapy: No Therapy  Non-Invasive Test Results: High-risk stress test findings: cardiac mortality >3%/year  Prior CABG: No previous CABG   Multivessel CAD by coronary CTA   History and Physical Interval Note:  04/12/2021 9:32 AM  Randy Hudson  has presented today for surgery, with the diagnosis of abnormal ct.  The various methods of treatment have been discussed with the patient and family. After consideration of risks, benefits and other options for treatment, the patient has consented to  Procedure(s): LEFT HEART CATH AND CORONARY ANGIOGRAPHY (N/A) as a surgical intervention.  The patient's history has been reviewed, patient examined, no change in status, stable for surgery.  I have reviewed the patient's chart and labs.  Questions were answered to the patient's satisfaction.     [name]

## 2021-04-13 DIAGNOSIS — E7849 Other hyperlipidemia: Secondary | ICD-10-CM | POA: Diagnosis not present

## 2021-04-13 DIAGNOSIS — I10 Essential (primary) hypertension: Secondary | ICD-10-CM

## 2021-04-13 DIAGNOSIS — E785 Hyperlipidemia, unspecified: Secondary | ICD-10-CM

## 2021-04-13 DIAGNOSIS — I25118 Atherosclerotic heart disease of native coronary artery with other forms of angina pectoris: Secondary | ICD-10-CM | POA: Diagnosis not present

## 2021-04-13 DIAGNOSIS — Z8249 Family history of ischemic heart disease and other diseases of the circulatory system: Secondary | ICD-10-CM | POA: Diagnosis not present

## 2021-04-13 DIAGNOSIS — R0681 Apnea, not elsewhere classified: Secondary | ICD-10-CM | POA: Diagnosis not present

## 2021-04-13 DIAGNOSIS — R002 Palpitations: Secondary | ICD-10-CM | POA: Diagnosis not present

## 2021-04-13 DIAGNOSIS — Z79899 Other long term (current) drug therapy: Secondary | ICD-10-CM | POA: Diagnosis not present

## 2021-04-13 LAB — CBC
HCT: 42.7 % (ref 39.0–52.0)
Hemoglobin: 14.2 g/dL (ref 13.0–17.0)
MCH: 29.4 pg (ref 26.0–34.0)
MCHC: 33.3 g/dL (ref 30.0–36.0)
MCV: 88.4 fL (ref 80.0–100.0)
Platelets: 279 10*3/uL (ref 150–400)
RBC: 4.83 MIL/uL (ref 4.22–5.81)
RDW: 13.2 % (ref 11.5–15.5)
WBC: 9 10*3/uL (ref 4.0–10.5)
nRBC: 0 % (ref 0.0–0.2)

## 2021-04-13 LAB — BASIC METABOLIC PANEL
Anion gap: 9 (ref 5–15)
BUN: 26 mg/dL — ABNORMAL HIGH (ref 8–23)
CO2: 26 mmol/L (ref 22–32)
Calcium: 8.9 mg/dL (ref 8.9–10.3)
Chloride: 102 mmol/L (ref 98–111)
Creatinine, Ser: 1.05 mg/dL (ref 0.61–1.24)
GFR, Estimated: >60 mL/min (ref >60)
Glucose, Bld: 93 mg/dL (ref 70–99)
Potassium: 4.6 mmol/L (ref 3.5–5.1)
Sodium: 137 mmol/L (ref 135–145)

## 2021-04-13 MED ORDER — CLOPIDOGREL BISULFATE 75 MG PO TABS: 75.0000 mg | ORAL_TABLET | Freq: Every day | ORAL | 3 refills | Status: DC

## 2021-04-13 MED ORDER — METOPROLOL SUCCINATE ER 25 MG PO TB24: 25.0000 mg | ORAL_TABLET | Freq: Every day | ORAL | 1 refills | Status: DC

## 2021-04-13 NOTE — Progress Notes (Signed)
He feels well this morning.  No chest pain.  Ambulated without angina. Right radial with ecchymosis but no hematoma. Digital images reviewed.  Very tiny diagonal had reduced flow after stent jail related to LAD procedure Kidney function is normal. Plan discharge today.

## 2021-04-13 NOTE — Discharge Summary (Addendum)
The patient has been seen in conjunction with Reino Bellis, NP-C. All aspects of care have been considered and discussed. The patient has been personally interviewed, examined, and all clinical data has been reviewed.  Images reviewed. Small jailed diagonal with reduced flow but no symptoms. Ecchymosis but no hematoma in right radial area.. Kidney function stable. Discharge and follow-up as planned.  Discharge Summary    Patient ID: Randy Hudson MRN: [id number]; DOB: 1946-01-01  Admit date: 04/12/2021 Discharge date: 04/13/2021  PCP:  Merryl Hacker, No   [facility] Providers Cardiologist:  [name], MD     Discharge Diagnoses    Principal Problem:   CAD (coronary artery disease), native coronary artery Active Problems:   Hyperlipidemia   Hypertension  Diagnostic Studies/Procedures    Cath: 04/12/21  Prox RCA lesion is 40% stenosed.   Mid RCA lesion is 90% stenosed.  This is a relatively small vessel.   RPDA lesion is 100% stenosed.  Left-to-right collaterals feeding the PDA.   3rd Mrg lesion is 50% stenosed.   Prox LAD lesion is 40% stenosed.   Mid LAD lesion is 90% stenosed.   A drug-eluting stent was successfully placed using a STENT ONYX FRONTIER 3.0X26, postdilated to 3.5 mm.   Post intervention, there is a 0% residual stenosis.   Dist LAD lesion is 40% stenosed.   The left ventricular systolic function is normal.   The left ventricular ejection fraction is 55-65% by visual estimate.   There is no aortic valve stenosis.   Significant tortuosity noted in the aorta leading to the ascending aorta may catheter torquing difficult.  Consider destination sheath if repeat cath performed from right radial.  The tortuosity made guide support difficult as well.     Plan for medical therapy of RCA given PDA is occluded with collaterals.  Would plan for clopidogrel monotherapy after his course of dual antiplatelet therapy given the diffuse disease in the other  vessels.  Diagnostic Dominance: Right    Intervention    _____________   History of Present Illness     Randy Hudson is a 75 y.o. male with PMH of palpitations, HTN, HLD and early family history of CAD. Seen in the office on 7/6 with complaints of dizziness, chest pain and palpitations. Underwent coronary CT with multivessel CAD in the LAD, RCA and ramus branch. Set up for outpatient cardiac cath.   Hospital Course     Underwent cardiac catheterization noted above with multivessel CAD.  90% mid RCA lesion, relatively small vessel with 100% stenosis RPDA, left-to-right collaterals.  90% mid LAD lesion treated with PCI/DES x1, 40% proximal LAD, 50% third OM and 40% distal LAD.  Placed on DAPT with aspirin/Plavix with recommendation for 6 months, but would continue plavix as monotherapy with diffuse disease. No complications noted overnight. Added low dose Toprol '25mg'$  daily at discharge. Reports he has been on atorvastatin '80mg'$  daily for years and not had well controlled lipids. Discussed referral for PCSK9i at discharge which he was agreeable to. Worked well with CR.   General: Well developed, well nourished, male appearing in no acute distress. Head: Normocephalic, atraumatic.  Neck: Supple without bruits, JVD. Lungs:  Resp regular and unlabored, CTA. Heart: RRR, S1, S2, no S3, S4, or murmur; no rub. Abdomen: Soft, non-tender, non-distended with normoactive bowel sounds. No hepatomegaly. No rebound/guarding. No obvious abdominal masses. Extremities: No clubbing, cyanosis, edema. Distal pedal pulses are 2+ bilaterally. Right cath site stable without bruising or hematoma Neuro: Alert and  oriented X 3. Moves all extremities spontaneously. Psych: Normal affect.  Patient was seen by Dr. Tamala Julian and deemed stable for discharge home. Follow up in the office has been arranged. Medications sent to patient's pharmacy of choice.   Did the patient have an acute coronary syndrome (MI, NSTEMI,  STEMI, etc) this admission?:  No                               Did the patient have a percutaneous coronary intervention (stent / angioplasty)?:  Yes.     Cath/PCI Registry Performance & Quality Measures: Aspirin prescribed? - Yes ADP Receptor Inhibitor (Plavix/Clopidogrel, Brilinta/Ticagrelor or Effient/Prasugrel) prescribed (includes medically managed patients)? - Yes High Intensity Statin (Lipitor 40-'80mg'$  or Crestor 20-'40mg'$ ) prescribed? - Yes For EF <40%, was ACEI/ARB prescribed? - Not Applicable (EF >/= [id number]) For EF <40%, Aldosterone Antagonist (Spironolactone or Eplerenone) prescribed? - Not Applicable (EF >/= [id number]) Cardiac Rehab Phase II ordered? - Yes      _____________  Discharge Vitals Blood pressure 124/82, pulse 76, temperature 98 F (36.7 C), temperature source Oral, resp. rate 18, height '5\' 5"'$  (1.651 m), weight 77.1 kg, SpO2 99 %.  Filed Weights   04/12/21 0706  Weight: 77.1 kg    Labs & Radiologic Studies    CBC Recent Labs    04/13/21 0150  WBC 9.0  HGB 14.2  HCT 42.7  MCV 88.4  PLT 123XX123   Basic Metabolic Panel Recent Labs    04/13/21 0150  NA 137  K 4.6  CL 102  CO2 26  GLUCOSE 93  BUN 26*  CREATININE 1.05  CALCIUM 8.9   Liver Function Tests No results for input(s): AST, ALT, ALKPHOS, BILITOT, PROT, ALBUMIN in the last 72 hours. No results for input(s): LIPASE, AMYLASE in the last 72 hours. High Sensitivity Troponin:   No results for input(s): TROPONINIHS in the last 720 hours.  BNP Invalid input(s): POCBNP D-Dimer No results for input(s): DDIMER in the last 72 hours. Hemoglobin A1C No results for input(s): HGBA1C in the last 72 hours. Fasting Lipid Panel No results for input(s): CHOL, HDL, LDLCALC, TRIG, CHOLHDL, LDLDIRECT in the last 72 hours. Thyroid Function Tests No results for input(s): TSH, T4TOTAL, T3FREE, THYROIDAB in the last 72 hours.  Invalid input(s): FREET3 _____________  CARDIAC CATHETERIZATION  Result Date:  04/12/2021 Formatting of this result is different from the original.   Prox RCA lesion is 40% stenosed.   Mid RCA lesion is 90% stenosed.  This is a relatively small vessel.   RPDA lesion is 100% stenosed.  Left-to-right collaterals feeding the PDA.   3rd Mrg lesion is 50% stenosed.   Prox LAD lesion is 40% stenosed.   Mid LAD lesion is 90% stenosed.   A drug-eluting stent was successfully placed using a STENT ONYX FRONTIER 3.0X26, postdilated to 3.5 mm.   Post intervention, there is a 0% residual stenosis.   Dist LAD lesion is 40% stenosed.   The left ventricular systolic function is normal.   The left ventricular ejection fraction is 55-65% by visual estimate.   There is no aortic valve stenosis. Significant tortuosity noted in the aorta leading to the ascending aorta may catheter torquing difficult.  Consider destination sheath if repeat cath performed from right radial.  The tortuosity made guide support difficult as well.  Plan for medical therapy of RCA given PDA is occluded with collaterals. Would plan for clopidogrel monotherapy after his course  of dual antiplatelet therapy given the diffuse disease in the other vessels.  CT CORONARY MORPH W/CTA COR W/SCORE W/CA W/CM &/OR WO/CM  Addendum Date: 04/01/2021   ADDENDUM REPORT: 04/01/2021 11:27 CLINICAL DATA:  75 year old male with family history of CAD, with chest pain. EXAM: Cardiac/Coronary  CTA TECHNIQUE: The patient was scanned on a [employer]. FINDINGS: A 100 kV prospective scan was triggered in the descending thoracic aorta at 111 HU's. Axial non-contrast 3 mm slices were carried out through the heart. The data set was analyzed on a dedicated work station and scored using the [place]. Gantry rotation speed was 250 msecs and collimation was .6 mm. Beta blockade and 0.8 mg of sl NTG was given. The 3D data set was reconstructed in 5% intervals of the 67-82 % of the R-R cycle. Diastolic phases were analyzed on a dedicated work station  using MPR, MIP and VRT modes. The patient received 80 cc of contrast. Aorta:  Normal size.  Aortic atherosclerosis.  No dissection. Aortic Valve:  Trileaflet.  No calcifications. Coronary Arteries:  Normal coronary origin.  Right dominance. RCA is a large dominant artery that gives rise to PDA and PLA. There is scattered calcified plaque with possible severe stenosis (75%) mid segment. The proximal small caliber PDA appears occluded and reconstitutes 6 mm after ostium. Left main is a large artery that gives rise to LAD and LCX arteries. Mild distal LM calcified plaque, 0-24%. LAD is a large vessel that has diffuse dense calcified plaque. Proximal and distal to the second diagonal bifurcation there is dark, soft plaque with what appears to be significant luminal stenosis (75-99%). Sending for FFR analysis. LCX is a non-dominant artery that gives rise to one large OM1 branch and 3 smaller distal OM branches. There is scattered calcified plaque, 0-24% stenosis. There is a small caliber ramus branch with calcified stenosis at a primary bifurcation. Difficult to determine severity of stenosis. Calcium score: LM: 60 LAD: 661 LCX: 209 RCA: 316 Total: 1245, 86 percentile. Other findings: Normal pulmonary vein drainage into the left atrium. Small PFO. Normal left atrial appendage without a thrombus. Normal size of the pulmonary artery. Please see radiology report for non cardiac findings. IMPRESSION: 1. Coronary calcium score of 1245. This was 39 percentile for age and sex matched control. 2. Normal coronary origin with right dominance. 3. Multivessel CAD with possible flow limiting stenosis in LAD, RCA as well as small caliber ramus branch. Occluded proximal PDA. Sending for FFR. 4. Recommend cardiac catheterization. Accurate measurement of luminal diameters are limited by dense calcified plaque. Electronically Signed   By: Candee Furbish MD   On: 04/01/2021 11:27   Result Date: 04/01/2021 EXAM: OVER-READ INTERPRETATION  CT  CHEST The following report is an over-read performed by radiologist Dr. Vinnie Langton of [facility], [place] on 04/01/2021. This over-read does not include interpretation of cardiac or coronary anatomy or pathology. The coronary calcium score/coronary CTA interpretation by the cardiologist is attached. COMPARISON:  None. FINDINGS: Atherosclerotic calcifications in the thoracic aorta. Within the visualized portions of the thorax there are no suspicious appearing pulmonary nodules or masses, there is no acute consolidative airspace disease, no pleural effusions, no pneumothorax and no lymphadenopathy. Visualized portions of the upper abdomen are unremarkable. There are no aggressive appearing lytic or blastic lesions noted in the visualized portions of the skeleton. IMPRESSION: 1.  Aortic Atherosclerosis (ICD10-I70.0). Electronically Signed: By: Vinnie Langton M.D. On: 04/01/2021 08:50   CT CORONARY FRACTIONAL FLOW RESERVE  DATA PREP  Result Date: 04/06/2021 EXAM: FFRCT ANALYSIS FINDINGS: FFRct analysis was performed on the original cardiac CT angiogram dataset. Diagrammatic representation of the FFRct analysis is provided in a separate PDF document in PACS. This dictation was created using the PDF document and an interactive 3D model of the results. 3D model is not available in the EMR/PACS. Normal FFR range is >0.80. 1. Left Main: Normal 2. LAD: Mid LAD 0.51, abnormal 3. LCX: Normal 4. Ramus: Small caliber 5. RCA: Occluded distal RCA IMPRESSION: 1.  Abnormal FFR of mid LAD (.51) 2.  Abornal FFR of distal RCA (occluded vessel) Note: These examples are not recommendations of [employer] and only provided as examples of what other customers are doing. Electronically Signed   By: Candee Furbish MD   On: 04/06/2021 16:36   Disposition   Pt is being discharged home today in good condition.  Follow-up Plans & Appointments     Follow-up Information     Jettie Booze, MD Follow up on 04/28/2021.    Specialties: Cardiology, Radiology, Interventional Cardiology Why: at 2:40pm for your follow up appt Contact information: [address] [place] [address] [phone]                Discharge Instructions     AMB Referral to [facility]   Complete by: As directed    Reason For Referral: Lipids   Amb Referral to Cardiac Rehabilitation   Complete by: As directed    Diagnosis: Coronary Stents   After initial evaluation and assessments completed: Virtual Based Care may be provided alone or in conjunction with Phase 2 Cardiac Rehab based on patient barriers.: Yes   Call MD for:  difficulty breathing, headache or visual disturbances   Complete by: As directed    Call MD for:  persistant dizziness or light-headedness   Complete by: As directed    Call MD for:  redness, tenderness, or signs of infection (pain, swelling, redness, odor or green/yellow discharge around incision site)   Complete by: As directed    Diet - low sodium heart healthy   Complete by: As directed    Discharge instructions   Complete by: As directed    Radial Site Care Refer to this sheet in the next few weeks. These instructions provide you with information on caring for yourself after your procedure. Your caregiver may also give you more specific instructions. Your treatment has been planned according to current medical practices, but problems sometimes occur. Call your caregiver if you have any problems or questions after your procedure. HOME CARE INSTRUCTIONS You may shower the day after the procedure. Remove the bandage (dressing) and gently wash the site with plain soap and water. Gently pat the site dry.  Do not apply powder or lotion to the site.  Do not submerge the affected site in water for 3 to 5 days.  Inspect the site at least twice daily.  Do not flex or bend the affected arm for 24 hours.  No lifting over 5 pounds (2.3 kg) for 5 days after your procedure.  Do not  drive home if you are discharged the same day of the procedure. Have someone else drive you.  You may drive 24 hours after the procedure unless otherwise instructed by your caregiver.  What to expect: Any bruising will usually fade within 1 to 2 weeks.  Blood that collects in the tissue (hematoma) may be painful to the touch. It should usually decrease in  size and tenderness within 1 to 2 weeks.  SEEK IMMEDIATE MEDICAL CARE IF: You have unusual pain at the radial site.  You have redness, warmth, swelling, or pain at the radial site.  You have drainage (other than a small amount of blood on the dressing).  You have chills.  You have a fever or persistent symptoms for more than 72 hours.  You have a fever and your symptoms suddenly get worse.  Your arm becomes pale, cool, tingly, or numb.  You have heavy bleeding from the site. Hold pressure on the site.   PLEASE DO NOT MISS ANY DOSES OF YOUR PLAVIX!!!!! Also keep a log of you blood pressures and bring back to your follow up appt. Please call the office with any questions.   Patients taking blood thinners should generally stay away from medicines like ibuprofen, Advil, Motrin, naproxen, and Aleve due to risk of stomach bleeding. You may take Tylenol as directed or talk to your primary doctor about alternatives.  PLEASE ENSURE THAT YOU DO NOT RUN OUT OF YOUR BRILINTA/PLAVIX. This medication is very important to remain on for at least one year. IF you have issues obtaining this medication due to cost please CALL the office 3-5 business days prior to running out in order to prevent missing doses of this medication.   Increase activity slowly   Complete by: As directed        Discharge Medications   Allergies as of 04/13/2021   No Known Allergies      Medication List     STOP taking these medications    metoprolol tartrate 100 MG tablet Commonly known as: LOPRESSOR       TAKE these medications    aspirin EC 81 MG tablet Take  1 tablet (81 mg total) by mouth daily. Swallow whole.   atorvastatin 80 MG tablet Commonly known as: LIPITOR Take 80 mg by mouth daily.   clopidogrel 75 MG tablet Commonly known as: PLAVIX Take 1 tablet (75 mg total) by mouth daily with breakfast. Start taking on: April 14, 2021   metoprolol succinate 25 MG 24 hr tablet Commonly known as: Toprol XL Take 1 tablet (25 mg total) by mouth daily. Take with or immediately following a meal.   nitroGLYCERIN 0.4 MG SL tablet Commonly known as: NITROSTAT Place 1 tablet (0.4 mg total) under the tongue every 5 (five) minutes as needed for chest pain.   PRESERVISION AREDS 2+MULTI VIT PO Take 1 capsule by mouth 2 (two) times daily.   tobramycin 0.3 % ophthalmic solution Commonly known as: TOBREX Place 1 drop into the right eye daily as needed (After injections).   triamterene-hydrochlorothiazide 37.5-25 MG capsule Commonly known as: DYAZIDE Take 1 capsule by mouth every morning.   Vitamin D 50 MCG (2000 UT) tablet Take 2,000 Units by mouth daily.        Outstanding Labs/Studies   N/a   Duration of Discharge Encounter   Greater than 30 minutes including physician time.  Signed, Reino Bellis, NP 04/13/2021, 11:24 AM

## 2021-04-13 NOTE — Progress Notes (Signed)
CARDIAC REHAB PHASE I   Offered to walk with pt, pt states independent ambulation. Denies CP, SOB, or dizziness, states his energy levels feel much better today. Pt educated on importance of ASA and Plavix. Pt given heart healthy diet. Reviewed site care, restrictions, and exercise guidelines. Will refer to CRP II [place]. Pt hopeful for d/c today.  [id number] Rufina Falco, RN BSN 04/13/2021 9:36 AM

## 2021-04-18 ENCOUNTER — Telehealth (HOSPITAL_COMMUNITY): Payer: Self-pay

## 2021-04-18 NOTE — Telephone Encounter (Signed)
Called patient to see if he is interested in the Cardiac Rehab Program. Patient expressed interest. Explained scheduling process and went over insurance, patient verbalized understanding. Will contact patient for scheduling once f/u has been completed.  °

## 2021-04-18 NOTE — Telephone Encounter (Signed)
Pt insurance is active and benefits verified through Medicare A/B. Co-pay $0.00, DED $233.00/$233.00 met, out of pocket $0.00/$0.00 met, co-insurance 20%. No pre-authorization required. Passport, 04/18/21 @ 12:19PM, YEB#[id number]   2ndary insurance is active and benefits verified through [place]. Co-pay $0.00, DED $0.00/$0.00 met, out of pocket $0.00/$0.00 met, co-insurance 0%. No pre-authorization required. Passport, 04/18/21 @ 12:21PM, XJD#[id number]   Will contact patient to see if he is interested in the Cardiac Rehab Program. If interested, patient will need to complete follow up appt. Once completed, patient will be contacted for scheduling upon review by the RN Navigator.

## 2021-04-27 NOTE — Progress Notes (Signed)
Cardiology Office Note   Date:  04/28/2021   ID:  Randy Hudson, DOB [birth year], MRN [id number]  PCP:  Kathyrn Lass, MD    No chief complaint on file.  CAD  Wt Readings from Last 3 Encounters:  04/28/21 178 lb 9.6 oz (81 kg)  04/12/21 170 lb (77.1 kg)  03/23/21 177 lb 3.2 oz (80.4 kg)       History of Present Illness: Randy Hudson is a 75 y.o. male  with family h/o CAD.  Son had CABG in 2022 at [facility]. Father had AVR.  Older brother had CABG at age 47. THey were all smokers.   Patient does not smoke.  Had an abnormal coronary CTA.  Cardiac cath was done: "Prox RCA lesion is 40% stenosed.   Mid RCA lesion is 90% stenosed.  This is a relatively small vessel.   RPDA lesion is 100% stenosed.  Left-to-right collaterals feeding the PDA.   3rd Mrg lesion is 50% stenosed.   Prox LAD lesion is 40% stenosed.   Mid LAD lesion is 90% stenosed.   A drug-eluting stent was successfully placed using a STENT ONYX FRONTIER 3.0X26, postdilated to 3.5 mm.   Post intervention, there is a 0% residual stenosis.   Dist LAD lesion is 40% stenosed.   The left ventricular systolic function is normal.   The left ventricular ejection fraction is 55-65% by visual estimate.   There is no aortic valve stenosis.   Significant tortuosity noted in the aorta leading to the ascending aorta may catheter torquing difficult.  Consider destination sheath if repeat cath performed from right radial.  The tortuosity made guide support difficult as well.     Plan for medical therapy of RCA given PDA is occluded with collaterals.  Would plan for clopidogrel monotherapy after his course of dual antiplatelet therapy given the diffuse disease in the other vessels."  Denies : Chest pain. Dizziness. Leg edema. Nitroglycerin use. Orthopnea. Palpitations. Paroxysmal nocturnal dyspnea. Shortness of breath. Syncope.    Wants to get more active. Want to go to cardiac rehab, and do yard work.  Home BP readings in the  [id number] systolic.     Past Medical History:  Diagnosis Date   Hyperlipidemia    Hypertension     Past Surgical History:  Procedure Laterality Date   CORONARY STENT INTERVENTION N/A 04/12/2021   Procedure: CORONARY STENT INTERVENTION;  Surgeon: Jettie Booze, MD;  Location: [facility];  Service: Cardiovascular;  Laterality: N/A;   LEFT HEART CATH AND CORONARY ANGIOGRAPHY N/A 04/12/2021   Procedure: LEFT HEART CATH AND CORONARY ANGIOGRAPHY;  Surgeon: Jettie Booze, MD;  Location: [facility];  Service: Cardiovascular;  Laterality: N/A;     Current Outpatient Medications  Medication Sig Dispense Refill   aspirin EC 81 MG tablet Take 1 tablet (81 mg total) by mouth daily. Swallow whole. 90 tablet 3   atorvastatin (LIPITOR) 80 MG tablet Take 80 mg by mouth daily.     Cholecalciferol (VITAMIN D) 50 MCG (2000 UT) tablet Take 2,000 Units by mouth daily.     clopidogrel (PLAVIX) 75 MG tablet Take 1 tablet (75 mg total) by mouth daily with breakfast. 90 tablet 3   metoprolol succinate (TOPROL XL) 25 MG 24 hr tablet Take 1 tablet (25 mg total) by mouth daily. Take with or immediately following a meal. 90 tablet 1   Multiple Vitamins-Minerals (PRESERVISION AREDS 2+MULTI VIT PO) Take 1 capsule by mouth 2 (  two) times daily.     nitroGLYCERIN (NITROSTAT) 0.4 MG SL tablet Place 1 tablet (0.4 mg total) under the tongue every 5 (five) minutes as needed for chest pain. 25 tablet 6   tobramycin (TOBREX) 0.3 % ophthalmic solution Place 1 drop into the right eye daily as needed (After injections).     triamterene-hydrochlorothiazide (DYAZIDE) 37.5-25 MG capsule Take 1 capsule by mouth every morning.     No current facility-administered medications for this visit.    Allergies:   Patient has no known allergies.    Social History:  The patient  reports that he has never smoked. He has never used smokeless tobacco.   Family History:  The patient's family history includes Colon  cancer in his father; Heart disease in his father.    ROS:  Please see the history of present illness.   Otherwise, review of systems are positive for bruising on knee after he bumped it.   All other systems are reviewed and negative.    PHYSICAL EXAM: VS:  BP 128/84   Pulse 79   Ht '5\' 5"'$  (1.651 m)   Wt 178 lb 9.6 oz (81 kg)   SpO2 96%   BMI 29.72 kg/m  , BMI Body mass index is 29.72 kg/m. GEN: Well nourished, well developed, in no acute distress HEENT: normal Neck: no JVD, carotid bruits, or masses Cardiac: RRR; no murmurs, rubs, or gallops,no edema  Respiratory:  clear to auscultation bilaterally, normal work of breathing GI: soft, nontender, nondistended, + BS MS: no deformity or atrophy; mild quarter sized swelling over radial artery Skin: warm and dry, no rash Neuro:  Strength and sensation are intact Psych: euthymic mood, full affect    Recent Labs: 04/13/2021: BUN 26; Creatinine, Ser 1.05; Hemoglobin 14.2; Platelets 279; Potassium 4.6; Sodium 137   Lipid Panel No results found for: CHOL, TRIG, HDL, CHOLHDL, VLDL, LDLCALC, LDLDIRECT   Other studies Reviewed: Additional studies/ records that were reviewed today with results demonstrating: LDL 133 in 12/2020.   ASSESSMENT AND PLAN:  CAD: Continue aggressive secondary prevention.  CTO of PDA.  Medical therapy.  HTN: The current medical regimen is effective;  continue present plan and medications. Hyperlipidemia: Whole food, plant based diet.  Continue atorvastatin.  Refer to lipid clinic for options to further reduce lipids.  Right wrist swelling after banging it on a piece of furniture. Warm compresses to that area for now.    Current medicines are reviewed at length with the patient today.  The patient concerns regarding his medicines were addressed.  The following changes have been made:  No change  Labs/ tests ordered today include:  No orders of the defined types were placed in this encounter.   Recommend  150 minutes/week of aerobic exercise Low fat, low carb, high fiber diet recommended  Disposition:   FU in 6 months   Signed, [name], MD  04/28/2021 2:43 PM    [facility] [place], [place], [place]  [address] Phone: [phone]; Fax: [phone]

## 2021-04-28 ENCOUNTER — Encounter: Payer: Self-pay | Admitting: Interventional Cardiology

## 2021-04-28 ENCOUNTER — Other Ambulatory Visit: Payer: Self-pay

## 2021-04-28 ENCOUNTER — Ambulatory Visit (INDEPENDENT_AMBULATORY_CARE_PROVIDER_SITE_OTHER): Payer: Medicare Other | Admitting: Interventional Cardiology

## 2021-04-28 VITALS — BP 128/84 | HR 79 | Ht 65.0 in | Wt 178.6 lb

## 2021-04-28 DIAGNOSIS — E782 Mixed hyperlipidemia: Secondary | ICD-10-CM | POA: Diagnosis not present

## 2021-04-28 DIAGNOSIS — I10 Essential (primary) hypertension: Secondary | ICD-10-CM | POA: Diagnosis not present

## 2021-04-28 DIAGNOSIS — I25118 Atherosclerotic heart disease of native coronary artery with other forms of angina pectoris: Secondary | ICD-10-CM | POA: Diagnosis not present

## 2021-04-28 NOTE — Patient Instructions (Signed)
Medication Instructions:  Your physician recommends that you continue on your current medications as directed. Please refer to the Current Medication list given to you today.  *If you need a refill on your cardiac medications before your next appointment, please call your pharmacy*   Lab Work: none If you have labs (blood work) drawn today and your tests are completely normal, you will receive your results only by: [place] (if you have MyChart) OR A paper copy in the mail If you have any lab test that is abnormal or we need to change your treatment, we will call you to review the results.   Testing/Procedures: none   Follow-Up: At [facility], you and your health needs are our priority.  As part of our continuing mission to provide you with exceptional heart care, we have created designated Provider Care Teams.  These Care Teams include your primary Cardiologist (physician) and Advanced Practice Providers (APPs -  Physician Assistants and Nurse Practitioners) who all work together to provide you with the care you need, when you need it.  We recommend signing up for the patient portal called "MyChart".  Sign up information is provided on this After Visit Summary.  MyChart is used to connect with patients for Virtual Visits (Telemedicine).  Patients are able to view lab/test results, encounter notes, upcoming appointments, etc.  Non-urgent messages can be sent to your provider as well.   To learn more about what you can do with MyChart, go to [email or web address].    Your next appointment:    11/10/21 at 8:00  The format for your next appointment:   In Person  Provider:   Casandra Doffing, MD   Other Instructions

## 2021-05-04 ENCOUNTER — Ambulatory Visit (INDEPENDENT_AMBULATORY_CARE_PROVIDER_SITE_OTHER): Payer: Medicare Other | Admitting: Pharmacist

## 2021-05-04 ENCOUNTER — Other Ambulatory Visit: Payer: Self-pay

## 2021-05-04 DIAGNOSIS — I25118 Atherosclerotic heart disease of native coronary artery with other forms of angina pectoris: Secondary | ICD-10-CM

## 2021-05-04 DIAGNOSIS — E7849 Other hyperlipidemia: Secondary | ICD-10-CM

## 2021-05-04 MED ORDER — PRALUENT 75 MG/ML ~~LOC~~ SOAJ: 1.0000 "pen " | SUBCUTANEOUS | 11 refills | Status: DC

## 2021-05-04 MED ORDER — EZETIMIBE 10 MG PO TABS: 10.0000 mg | ORAL_TABLET | Freq: Every day | ORAL | 3 refills | Status: DC

## 2021-05-04 NOTE — Patient Instructions (Addendum)
It was nice to meet you today!  Your LDL cholesterol is 133 and your goal is < 70  I will submit information to your insurance to see if they will cover Repatha or Praluent injections. This type of medication is stored in the fridge, injected in the fatty tissue of your stomach, and lowers your LDL cholesterol by 60%. It also reduces your chance of having a heart attack or stroke, and helps with plaque regression as well.   I'll give you a call when I know how much the injections will cost  Continue taking your atorvastatin and your other medications  Ideally we will recheck your cholesterol in another 2 months  Start walking 20-30 minutes most days a week

## 2021-05-04 NOTE — Progress Notes (Signed)
Patient ID: [name]                 DOB: 28-Jun-1946                    MRN: [id number]     HPI: Randy Hudson is a 75 y.o. male patient referred to lipid clinic by Dr Irish Lack. PMH is significant for HTN, HLD, and family history of CAD. He reported chest pain at visit with Dr Irish Lack on 03/23/21. He underwent CT FFR on 04/01/21 which showed elevated calcium score of 1245 (86th percentile for age and sex matched) and multivessel CAD. FFR analysis showed abnormal FFR of mid LAD (0.51) and distal RCA (occluded vessel). He underwent cardiac cath on 04/12/21 which showed diffuse multivessel stenosis. Plan for medical therapy of RCA (90% stenosed) since PDA was occluded with collaterals; stent placed in mid LAD that was 90% stenosed. EF was 55-65%. Pt referred to lipid clinic to help further reduce his CV risk.  Pt presents today in good spirits. Reports taking atorvastatin '80mg'$  daily for years, no issues tolerating. Has not taken anything else for his cholesterol before. Eats a heart healthy diet.  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 Denies tobacco use.  Labs: 01/05/21: TC 212, TG 182, HDL 46,  LDL 133, nonHDL 166 (atorvastatin '80mg'$  daily)  Past Medical History:  Diagnosis Date   Hyperlipidemia    Hypertension     Current Outpatient Medications on File Prior to Visit  Medication Sig Dispense Refill   aspirin EC 81 MG tablet Take 1 tablet (81 mg total) by mouth daily. Swallow whole. 90 tablet 3   atorvastatin (LIPITOR) 80 MG tablet Take 80 mg by mouth daily.     Cholecalciferol (VITAMIN D) 50 MCG (2000 UT) tablet Take 2,000 Units by mouth  daily.     clopidogrel (PLAVIX) 75 MG tablet Take 1 tablet (75 mg total) by mouth daily with breakfast. 90 tablet 3   metoprolol succinate (TOPROL XL) 25 MG 24 hr tablet Take 1 tablet (25 mg total) by mouth daily. Take with or immediately following a meal. 90 tablet 1   Multiple Vitamins-Minerals (PRESERVISION AREDS 2+MULTI VIT PO) Take 1 capsule by mouth 2 (two) times daily.     nitroGLYCERIN (NITROSTAT) 0.4 MG SL tablet Place 1 tablet (0.4 mg total) under the tongue every 5 (five) minutes as needed for chest pain. 25 tablet 6   tobramycin (TOBREX) 0.3 % ophthalmic solution Place 1 drop into the right eye daily as needed (After injections).     triamterene-hydrochlorothiazide (DYAZIDE) 37.5-25 MG capsule Take 1 capsule by mouth every morning.     No current facility-administered medications on file prior to visit.    No Known Allergies  Assessment/Plan:  1. Hyperlipidemia - LDL '133mg'$ /dL on atorvastatin '80mg'$  daily, above goal < 70 due to ASCVD. Discussed addition of PCSK9i therapy today as this will most effectively bring patient's LDL to goal and reduce his CV risk. Discussed expected benefits, side effects, and injection technique. Praluent is preferred on pt's formulary. Will send rx to pharmacy to assess copay. Advised pt to increase walking as able.  Will plan to recheck lipids in 2 months.  Bless Lisenby E. Yeilyn Gent, PharmD, BCACP, [place] [id number] [address], [place],  [address] Phone: [phone]; Fax: [phone] 05/04/2021 9:58 AM  Addendum: Pt has high deductible. First copay for Praluent is cost prohibitive at $471. Will start ezetimibe '10mg'$  daily. Also message pt the enrollment form for Leqvio injections. He has a Medicare supplement policy so copay for this should be better than PCSK9i.

## 2021-06-03 ENCOUNTER — Telehealth (HOSPITAL_COMMUNITY): Payer: Self-pay

## 2021-06-03 NOTE — Telephone Encounter (Signed)
Called patient to see if she was interested in participating in the Cardiac Rehab Program. Patient stated yes. Patient will come in for orientation on 06/23/21 @ 8:45AM and will attend the 7:15AM exercise class.   Tourist information centre manager.

## 2021-06-05 ENCOUNTER — Encounter (HOSPITAL_BASED_OUTPATIENT_CLINIC_OR_DEPARTMENT_OTHER): Payer: Medicare Other | Admitting: Cardiology

## 2021-06-06 ENCOUNTER — Telehealth: Payer: Self-pay | Admitting: Pharmacist

## 2021-06-06 NOTE — Telephone Encounter (Signed)
Called pt to schedule Leqvio injection. $0 copay when billed through Medicare B + his supplement. Does have $233 annual deductible to meet but he has already met this for 2022 year. Pt scheduled for Leqvio injection on 9/26.

## 2021-06-07 ENCOUNTER — Other Ambulatory Visit: Payer: Self-pay | Admitting: Pharmacist

## 2021-06-07 DIAGNOSIS — I25118 Atherosclerotic heart disease of native coronary artery with other forms of angina pectoris: Secondary | ICD-10-CM

## 2021-06-07 DIAGNOSIS — E7849 Other hyperlipidemia: Secondary | ICD-10-CM

## 2021-06-10 ENCOUNTER — Other Ambulatory Visit (HOSPITAL_COMMUNITY): Payer: Self-pay | Admitting: *Deleted

## 2021-06-13 ENCOUNTER — Ambulatory Visit (HOSPITAL_COMMUNITY)
Admission: RE | Admit: 2021-06-13 | Discharge: 2021-06-13 | Disposition: A | Discharge status: Home / Self Care | Payer: Medicare Other | Source: Ambulatory Visit | Attending: Interventional Cardiology | Admitting: Interventional Cardiology

## 2021-06-13 DIAGNOSIS — E7849 Other hyperlipidemia: Secondary | ICD-10-CM | POA: Insufficient documentation

## 2021-06-13 DIAGNOSIS — I25118 Atherosclerotic heart disease of native coronary artery with other forms of angina pectoris: Secondary | ICD-10-CM | POA: Diagnosis not present

## 2021-06-13 MED ORDER — INCLISIRAN SODIUM 284 MG/1.5ML ~~LOC~~ SOSY
284.0000 mg | PREFILLED_SYRINGE | Freq: Once | SUBCUTANEOUS | Status: AC
  Administered 2021-06-13: 284 mg via SUBCUTANEOUS
  Filled 2021-06-13: qty 1.5

## 2021-06-15 ENCOUNTER — Other Ambulatory Visit: Payer: Self-pay

## 2021-06-15 ENCOUNTER — Ambulatory Visit (HOSPITAL_BASED_OUTPATIENT_CLINIC_OR_DEPARTMENT_OTHER): Payer: Medicare Other | Attending: Interventional Cardiology | Admitting: Cardiology

## 2021-06-15 DIAGNOSIS — G4730 Sleep apnea, unspecified: Secondary | ICD-10-CM | POA: Diagnosis present

## 2021-06-15 DIAGNOSIS — G4733 Obstructive sleep apnea (adult) (pediatric): Secondary | ICD-10-CM | POA: Diagnosis not present

## 2021-06-15 DIAGNOSIS — R072 Precordial pain: Secondary | ICD-10-CM | POA: Diagnosis not present

## 2021-06-15 DIAGNOSIS — R0683 Snoring: Secondary | ICD-10-CM

## 2021-06-16 ENCOUNTER — Telehealth (HOSPITAL_COMMUNITY): Payer: Self-pay

## 2021-06-16 NOTE — Telephone Encounter (Signed)
Successful telephone encounter to Randy Hudson to confirm cardiac rehab orientation appointment for 06/23/21. Health history completed. All patient questions answered.

## 2021-06-19 NOTE — Procedures (Signed)
   Patient Name: Randy Hudson, Randy Hudson Date:06/15/2021 Gender: Male D.O.B: [birth year] Age (years): 58 Referring Provider: [name] Height (inches): 41 Interpreting Physician: Fransico Him MD, ABSM Weight (lbs): 170 RPSGT: Carolin Coy BMI: 28 MRN: [id number] Neck Size: 15.50  CLINICAL INFORMATION Sleep Study Type: NPSG  Indication for sleep study: Fatigue, Hypertension, Snoring  Epworth Sleepiness Score: 2  SLEEP STUDY TECHNIQUE As per the AASM Manual for the Scoring of Sleep and Associated Events v2.3 (April 2016) with a hypopnea requiring 4% desaturations.  The channels recorded and monitored were frontal, central and occipital EEG, electrooculogram (EOG), submentalis EMG (chin), nasal and oral airflow, thoracic and abdominal wall motion, anterior tibialis EMG, snore microphone, electrocardiogram, and pulse oximetry.  MEDICATIONS Medications self-administered by patient taken the night of the study : N/A  SLEEP ARCHITECTURE The study was initiated at 9:51:21 PM and ended at 4:37:45 AM.  Sleep onset time was 25.3 minutes and the sleep efficiency was 39.6%. The total sleep time was 161 minutes.  Stage REM latency was N/A minutes.  The patient spent 23.0% of the night in stage N1 sleep, 77.0% in stage N2 sleep, 0.0% in stage N3 and 0% in REM.  Alpha intrusion was absent.  Supine sleep was 0.00%.  RESPIRATORY PARAMETERS The overall apnea/hypopnea index (AHI) was 55.2 per hour. There were 31 total apneas, including 22 obstructive, 5 central and 4 mixed apneas. There were 117 hypopneas and 45 RERAs.  The AHI during Stage REM sleep was N/A per hour.  AHI while supine was N/A per hour.  The mean oxygen saturation was 91.7%. The minimum SpO2 during sleep was 86.0%.  moderate snoring was noted during this study.  CARDIAC DATA The 2 lead EKG demonstrated sinus rhythm. The mean heart rate was 60.5 beats per minute. Other EKG findings include: None.  LEG  MOVEMENT DATA The total PLMS were 0 with a resulting PLMS index of 0.0. Associated arousal with leg movement index was 0.0 .  IMPRESSIONS - Severe obstructive sleep apnea occurred during this study (AHI = 55.2/h). - No significant central sleep apnea occurred during this study (CAI = 1.9/h). - Mild oxygen desaturation was noted during this study (Min O2 = 86.0%). - The patient snored with moderate snoring volume. - No cardiac abnormalities were noted during this study. - Clinically significant periodic limb movements did not occur during sleep. No significant associated arousals.  DIAGNOSIS - Obstructive Sleep Apnea (G47.33)  RECOMMENDATIONS - Therapeutic CPAP titration to determine optimal pressure required to alleviate sleep disordered breathing. - Avoid alcohol, sedatives and other CNS depressants that may worsen sleep apnea and disrupt normal sleep architecture. - Sleep hygiene should be reviewed to assess factors that may improve sleep quality. - Weight management and regular exercise should be initiated or continued if appropriate.  [Electronically signed] 06/19/2021 03:08 PM  Fransico Him MD, ABSM Diplomate, American Board of Sleep Medicine

## 2021-06-23 ENCOUNTER — Encounter (HOSPITAL_COMMUNITY)
Admission: RE | Admit: 2021-06-23 | Discharge: 2021-06-23 | Disposition: A | Discharge status: Home / Self Care | Payer: Medicare Other | Source: Ambulatory Visit | Attending: Interventional Cardiology | Admitting: Interventional Cardiology

## 2021-06-23 ENCOUNTER — Other Ambulatory Visit: Payer: Self-pay

## 2021-06-23 VITALS — BP 120/62 | HR 79 | Ht 66.0 in | Wt 173.3 lb

## 2021-06-23 DIAGNOSIS — Z955 Presence of coronary angioplasty implant and graft: Secondary | ICD-10-CM | POA: Insufficient documentation

## 2021-06-23 NOTE — Progress Notes (Signed)
Cardiac Individual Treatment Plan  Patient Details  Name: Randy Hudson MRN: [id number] Date of Birth: 07-31-1946 Referring Provider:   Flowsheet Row CARDIAC REHAB PHASE II ORIENTATION from 06/23/2021 in [place]  Referring Provider dr. 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 Swallow whole., Disp: 90 tablet, Rfl: 3   atorvastatin (LIPITOR) 80 MG tablet, Take 80 mg by mouth in the morning., Disp: , Rfl:    carboxymethylcellul-glycerin (LUBRICANT DROPS/DUAL-ACTION) 0.5-0.9 % ophthalmic solution, Place 1 drop into both eyes 3 (three) times daily as needed (dry/irritated eyes.)., Disp: , Rfl:    Cholecalciferol (VITAMIN D) 50 MCG (2000 UT) tablet, Take 2,000 Units by mouth in the morning., Disp: , Rfl:    clopidogrel (PLAVIX) 75 MG tablet, Take 1 tablet (75 mg total) by mouth daily with breakfast., Disp: 90 tablet, Rfl: 3   inclisiran (LEQVIO) 284 MG/1.5ML SOSY injection, Inject 284 mg into the skin as directed. Administered subcutaneously every 6 months, Disp: , Rfl:    metoprolol succinate (TOPROL XL) 25 MG 24 hr tablet, Take 1 tablet (25 mg total) by mouth daily. Take with or immediately following a meal., Disp: 90 tablet, Rfl: 1   Multiple Vitamins-Minerals (PRESERVISION AREDS 2+MULTI VIT PO), Take 1 capsule by mouth 2 (two) times daily., Disp: , Rfl:    nitroGLYCERIN (NITROSTAT) 0.4 MG SL tablet, Place 1 tablet (0.4 mg total) under the tongue every 5 (five) minutes as  needed for chest pain., Disp: 25 tablet, Rfl: 6   tobramycin (TOBREX) 0.3 % ophthalmic solution, Place 1 drop into the right eye as directed. Instill 1 drop into right eye 4 times daily the day before, the day of & the day following eylea intravitreal injection, Disp: , Rfl:    triamterene-hydrochlorothiazide (DYAZIDE) 37.5-25 MG capsule, Take 1 capsule by mouth every morning., Disp: , Rfl:    ezetimibe (ZETIA) 10 MG tablet, Take 1 tablet (10 mg total) by mouth daily. (Patient not taking: Reported on 06/15/2021), Disp: 90 tablet, Rfl: 3  Past Medical History: Past Medical History:  Diagnosis Date   Hyperlipidemia    Hypertension     Tobacco Use: Social History   Tobacco Use  Smoking Status Never  Smokeless Tobacco Never    Labs: Recent Review Flowsheet Data   There is no flowsheet data to display.     Capillary Blood Glucose: No results found for: GLUCAP   Exercise Target Goals: Exercise Program Goal: Individual exercise prescription set using results from initial 6 min walk test and THRR while considering  patient's activity barriers and safety.   Exercise Prescription Goal: Initial exercise prescription builds to 30-45 minutes a day of aerobic activity, 2-3 days per week.  Home exercise guidelines will be given to patient during program as part of exercise prescription that the participant will acknowledge.  Activity Barriers & Risk Stratification:  Activity Barriers & Cardiac Risk Stratification - 06/23/21 1029       Activity Barriers &  Cardiac Risk Stratification   Activity Barriers Deconditioning    Cardiac Risk Stratification High             6 Minute Walk:  6 Minute Walk     Row Name 06/23/21 0951         6 Minute Walk   Phase Initial     Distance 1200 feet     Walk Time 6 minutes     # of Rest Breaks 0     MPH 2.27     METS 2.36     RPE 9     Perceived Dyspnea  0     VO2 Peak 8.26     Symptoms No     Resting HR 71 bpm     Resting BP 120/62      Resting Oxygen Saturation  97 %     Exercise Oxygen Saturation  during 6 min walk 98 %     Max Ex. HR 90 bpm     Max Ex. BP 122/72     2 Minute Post BP 120/70              Oxygen Initial Assessment:   Oxygen Re-Evaluation:   Oxygen Discharge (Final Oxygen Re-Evaluation):   Initial Exercise Prescription:  Initial Exercise Prescription - 06/23/21 0900       Date of Initial Exercise RX and Referring Provider   Date 06/23/21    Referring Provider dr. [name], MD    Expected Discharge Date 08/19/21      Recumbant Bike   Level 1.5    Minutes 15    METs 1.9      NuStep   Level 2    SPM 75    Minutes 15    METs 1.9      Prescription Details   Frequency (times per week) 3    Duration Progress to 30 minutes of continuous aerobic without signs/symptoms of physical distress      Intensity   THRR 40-80% of Max Heartrate 59-118    Ratings of Perceived Exertion 11-13    Perceived Dyspnea 0-4      Progression   Progression Continue progressive overload as per policy without signs/symptoms or physical distress.      Resistance Training   Training Prescription Yes    Weight 3    Reps 10-15             Perform Capillary Blood Glucose checks as needed.  Exercise Prescription Changes:   Exercise Comments:   Exercise Goals and Review:   Exercise Goals     Row Name 06/23/21 1030             Exercise Goals   Increase Physical Activity Yes       Intervention Provide advice, education, support and counseling about physical activity/exercise needs.;Develop an individualized exercise prescription for aerobic and resistive training based on initial evaluation findings, risk stratification, comorbidities and participant's personal goals.       Expected Outcomes Short Term: Attend rehab on a regular basis to increase amount of physical activity.;Long Term: Add in home exercise to make exercise part of routine and to increase amount of physical  activity.;Long Term: Exercising regularly at least 3-5 days a week.       Increase Strength and Stamina Yes       Intervention Provide advice, education, support and counseling about physical activity/exercise needs.;Develop an individualized exercise prescription for aerobic and resistive training based on initial evaluation findings,  risk stratification, comorbidities and participant's personal goals.       Expected Outcomes Short Term: Increase workloads from initial exercise prescription for resistance, speed, and METs.;Short Term: Perform resistance training exercises routinely during rehab and add in resistance training at home;Long Term: Improve cardiorespiratory fitness, muscular endurance and strength as measured by increased METs and functional capacity (6MWT)       Able to understand and use rate of perceived exertion (RPE) scale Yes       Intervention Provide education and explanation on how to use RPE scale       Expected Outcomes Short Term: Able to use RPE daily in rehab to express subjective intensity level;Long Term:  Able to use RPE to guide intensity level when exercising independently       Knowledge and understanding of Target Heart Rate Range (THRR) Yes       Intervention Provide education and explanation of THRR including how the numbers were predicted and where they are located for reference       Expected Outcomes Short Term: Able to state/look up THRR;Long Term: Able to use THRR to govern intensity when exercising independently;Short Term: Able to use daily as guideline for intensity in rehab       Understanding of Exercise Prescription Yes       Intervention Provide education, explanation, and written materials on patient's individual exercise prescription       Expected Outcomes Short Term: Able to explain program exercise prescription;Long Term: Able to explain home exercise prescription to exercise independently                Exercise Goals Re-Evaluation  :   Discharge Exercise Prescription (Final Exercise Prescription Changes):   Nutrition:  Target Goals: Understanding of nutrition guidelines, daily intake of sodium 1500mg , cholesterol 200mg , calories 30% from fat and 7% or less from saturated fats, daily to have 5 or more servings of fruits and vegetables.  Biometrics:  Pre Biometrics - 06/23/21 1028       Pre Biometrics   Waist Circumference 39 inches    Hip Circumference 38.5 inches    Waist to Hip Ratio 1.01 %    Triceps Skinfold 13 mm    % Body Fat 27 %    Grip Strength 42 kg    Flexibility --   pt unable to reach   Single Leg Stand 4.4 seconds              Nutrition Therapy Plan and Nutrition Goals:   Nutrition Assessments:  MEDIFICTS Score Key: ?70 Need to make dietary changes  40-70 Heart Healthy Diet ? 40 Therapeutic Level Cholesterol Diet    Picture Your Plate Scores: <66 Unhealthy dietary pattern with much room for improvement. 41-50 Dietary pattern unlikely to meet recommendations for good health and room for improvement. 51-60 More healthful dietary pattern, with some room for improvement.  >60 Healthy dietary pattern, although there may be some specific behaviors that could be improved.    Nutrition Goals Re-Evaluation:   Nutrition Goals Re-Evaluation:   Nutrition Goals Discharge (Final Nutrition Goals Re-Evaluation):   Psychosocial: Target Goals: Acknowledge presence or absence of significant depression and/or stress, maximize coping skills, provide positive support system. Participant is able to verbalize types and ability to use techniques and skills needed for reducing stress and depression.  Initial Review & Psychosocial Screening:  Initial Psych Review & Screening - 06/23/21 1015       Initial Review   Current issues with None Identified  Family Dynamics   Good Support System? Yes    Comments Patient is married and has good support. Demonstrates positive coping skills and  willingness to work on healthy lifestyle changes to improve health. He has not been a formal exerciser. An activity he enjoys is cleaning and waxing his muscle cars in his garage.      Barriers   Psychosocial barriers to participate in program There are no identifiable barriers or psychosocial needs.      Screening Interventions   Interventions Encouraged to exercise    Expected Outcomes Short Term goal: Identification and review with participant of any Quality of Life or Depression concerns found by scoring the questionnaire.   No QOL concerns identified. No depression concerns            Quality of Life Scores:  Quality of Life - 06/23/21 1037       Quality of Life   Select Quality of Life      Quality of Life Scores   Health/Function Pre 28 %    Socioeconomic Pre 29.29 %    Psych/Spiritual Pre 30 %    Family Pre 27.6 %    GLOBAL Pre 28.62 %            Scores of 19 and below usually indicate a poorer quality of life in these areas.  A difference of  2-3 points is a clinically meaningful difference.  A difference of 2-3 points in the total score of the Quality of Life Index has been associated with significant improvement in overall quality of life, self-image, physical symptoms, and general health in studies assessing change in quality of life.  PHQ-9: Recent Review Flowsheet Data     Depression screen [facility] 2/9 06/23/2021   Decreased Interest 0   Down, Depressed, Hopeless 0   PHQ - 2 Score 0      Interpretation of Total Score  Total Score Depression Severity:  1-4 = Minimal depression, 5-9 = Mild depression, 10-14 = Moderate depression, 15-19 = Moderately severe depression, 20-27 = Severe depression   Psychosocial Evaluation and Intervention:   Psychosocial Re-Evaluation:   Psychosocial Discharge (Final Psychosocial Re-Evaluation):   Vocational Rehabilitation: Provide vocational rehab assistance to qualifying candidates.   Vocational Rehab Evaluation &  Intervention:   Education: Education Goals: Education classes will be provided on a weekly basis, covering required topics. Participant will state understanding/return demonstration of topics presented.  Learning Barriers/Preferences:  Learning Barriers/Preferences - 06/23/21 1038       Learning Barriers/Preferences   Learning Barriers Sight;Hearing   Hearing aides, Macular edema right eye, glasses.   Learning Preferences Audio;Computer/Internet;Group Instruction;Individual Instruction;Pictoral;Skilled Demonstration;Verbal Instruction;Video;Written Material             Education Topics: Count Your Pulse:  -Group instruction provided by verbal instruction, demonstration, patient participation and written materials to support subject.  Instructors address importance of being able to find your pulse and how to count your pulse when at home without a heart monitor.  Patients get hands on experience counting their pulse with staff help and individually.   Heart Attack, Angina, and Risk Factor Modification:  -Group instruction provided by verbal instruction, video, and written materials to support subject.  Instructors address signs and symptoms of angina and heart attacks.    Also discuss risk factors for heart disease and how to make changes to improve heart health risk factors.   Functional Fitness:  -Group instruction provided by verbal instruction, demonstration, patient participation, and written materials to  support subject.  Instructors address safety measures for doing things around the house.  Discuss how to get up and down off the floor, how to pick things up properly, how to safely get out of a chair without assistance, and balance training.   Meditation and Mindfulness:  -Group instruction provided by verbal instruction, patient participation, and written materials to support subject.  Instructor addresses importance of mindfulness and meditation practice to help reduce  stress and improve awareness.  Instructor also leads participants through a meditation exercise.    Stretching for Flexibility and Mobility:  -Group instruction provided by verbal instruction, patient participation, and written materials to support subject.  Instructors lead participants through series of stretches that are designed to increase flexibility thus improving mobility.  These stretches are additional exercise for major muscle groups that are typically performed during regular warm up and cool down.   Hands Only CPR:  -Group verbal, video, and participation provides a basic overview of AHA guidelines for community CPR. Role-play of emergencies allow participants the opportunity to practice calling for help and chest compression technique with discussion of AED use.   Hypertension: -Group verbal and written instruction that provides a basic overview of hypertension including the most recent diagnostic guidelines, risk factor reduction with self-care instructions and medication management.    Nutrition I class: Heart Healthy Eating:  -Group instruction provided by PowerPoint slides, verbal discussion, and written materials to support subject matter. The instructor gives an explanation and review of the Therapeutic Lifestyle Changes diet recommendations, which includes a discussion on lipid goals, dietary fat, sodium, fiber, plant stanol/sterol esters, sugar, and the components of a well-balanced, healthy diet.   Nutrition II class: Lifestyle Skills:  -Group instruction provided by PowerPoint slides, verbal discussion, and written materials to support subject matter. The instructor gives an explanation and review of label reading, grocery shopping for heart health, heart healthy recipe modifications, and ways to make healthier choices when eating out.   Diabetes Question & Answer:  -Group instruction provided by PowerPoint slides, verbal discussion, and written materials to support  subject matter. The instructor gives an explanation and review of diabetes co-morbidities, pre- and post-prandial blood glucose goals, pre-exercise blood glucose goals, signs, symptoms, and treatment of hypoglycemia and hyperglycemia, and foot care basics.   Diabetes Blitz:  -Group instruction provided by PowerPoint slides, verbal discussion, and written materials to support subject matter. The instructor gives an explanation and review of the physiology behind type 1 and type 2 diabetes, diabetes medications and rational behind using different medications, pre- and post-prandial blood glucose recommendations and Hemoglobin A1c goals, diabetes diet, and exercise including blood glucose guidelines for exercising safely.    Portion Distortion:  -Group instruction provided by PowerPoint slides, verbal discussion, written materials, and food models to support subject matter. The instructor gives an explanation of serving size versus portion size, changes in portions sizes over the last 20 years, and what consists of a serving from each food group.   Stress Management:  -Group instruction provided by verbal instruction, video, and written materials to support subject matter.  Instructors review role of stress in heart disease and how to cope with stress positively.     Exercising on Your Own:  -Group instruction provided by verbal instruction, power point, and written materials to support subject.  Instructors discuss benefits of exercise, components of exercise, frequency and intensity of exercise, and end points for exercise.  Also discuss use of nitroglycerin and activating EMS.  Review options of places  to exercise outside of rehab.  Review guidelines for sex with heart disease.   Cardiac Drugs I:  -Group instruction provided by verbal instruction and written materials to support subject.  Instructor reviews cardiac drug classes: antiplatelets, anticoagulants, beta blockers, and statins.   Instructor discusses reasons, side effects, and lifestyle considerations for each drug class.   Cardiac Drugs II:  -Group instruction provided by verbal instruction and written materials to support subject.  Instructor reviews cardiac drug classes: angiotensin converting enzyme inhibitors (ACE-I), angiotensin II receptor blockers (ARBs), nitrates, and calcium channel blockers.  Instructor discusses reasons, side effects, and lifestyle considerations for each drug class.   Anatomy and Physiology of the Circulatory System:  Group verbal and written instruction and models provide basic cardiac anatomy and physiology, with the coronary electrical and arterial systems. Review of: AMI, Angina, Valve disease, Heart Failure, Peripheral Artery Disease, Cardiac Arrhythmia, Pacemakers, and the ICD.   Other Education:  -Group or individual verbal, written, or video instructions that support the educational goals of the cardiac rehab program.   Holiday Eating Survival Tips:  -Group instruction provided by PowerPoint slides, verbal discussion, and written materials to support subject matter. The instructor gives patients tips, tricks, and techniques to help them not only survive but enjoy the holidays despite the onslaught of food that accompanies the holidays.   Knowledge Questionnaire Score:  Knowledge Questionnaire Score - 06/23/21 1040       Knowledge Questionnaire Score   Pre Score 22/24             Core Components/Risk Factors/Patient Goals at Admission:  Personal Goals and Risk Factors at Admission - 06/23/21 1043       Core Components/Risk Factors/Patient Goals on Admission    Weight Management Yes;Weight Loss    Intervention Weight Management: Develop a combined nutrition and exercise program designed to reach desired caloric intake, while maintaining appropriate intake of nutrient and fiber, sodium and fats, and appropriate energy expenditure required for the weight goal.;Weight  Management: Provide education and appropriate resources to help participant work on and attain dietary goals.;Weight Management/Obesity: Establish reasonable short term and long term weight goals.;Obesity: Provide education and appropriate resources to help participant work on and attain dietary goals.    Admit Weight 173 lb 4.5 oz (78.6 kg)    Expected Outcomes Short Term: Continue to assess and modify interventions until short term weight is achieved;Long Term: Adherence to nutrition and physical activity/exercise program aimed toward attainment of established weight goal;Understanding recommendations for meals to include 15-35% energy as protein, 25-35% energy from fat, 35-60% energy from carbohydrates, less than 200mg  of dietary cholesterol, 20-35 gm of total fiber daily;Understanding of distribution of calorie intake throughout the day with the consumption of 4-5 meals/snacks    Hypertension Yes    Intervention Provide education on lifestyle modifcations including regular physical activity/exercise, weight management, moderate sodium restriction and increased consumption of fresh fruit, vegetables, and low fat dairy, alcohol moderation, and smoking cessation.;Monitor prescription use compliance.    Expected Outcomes Short Term: Continued assessment and intervention until BP is < 140/29mm HG in hypertensive participants. < 130/17mm HG in hypertensive participants with diabetes, heart failure or chronic kidney disease.;Long Term: Maintenance of blood pressure at goal levels.    Lipids Yes    Intervention Provide education and support for participant on nutrition & aerobic/resistive exercise along with prescribed medications to achieve LDL 70mg , HDL >40mg .    Expected Outcomes Short Term: Participant states understanding of desired cholesterol values and is compliant  with medications prescribed. Participant is following exercise prescription and nutrition guidelines.;Long Term: Cholesterol controlled with  medications as prescribed, with individualized exercise RX and with personalized nutrition plan. Value goals: LDL < 70mg , HDL > 40 mg.    Personal Goal Other Yes    Personal Goal Short term: walk more and be healthier. Long term: gain strength, energy and lose wt    Intervention Will continue to monitor pt and progress workloads as tolerated without sign or symptom.    Expected Outcomes Pt will achieve his goals             Core Components/Risk Factors/Patient Goals Review:    Core Components/Risk Factors/Patient Goals at Discharge (Final Review):    ITP Comments: Dr. Radford Pax, Medical Director  Comments: Patient attended orientation on 06/23/2021 to review rules and guidelines for program.  Completed 6 minute walk test, Intitial ITP, and exercise prescription.  VSS. Telemetry-NSR.  Asymptomatic. Safety measures and social distancing in place per CDC guidelines.

## 2021-06-23 NOTE — Progress Notes (Signed)
Cardiac Rehab Medication Review   Does the patient  feel that his/her medications are working for him/her?  YES   Has the patient been experiencing any side effects to the medications prescribed?  NO  Does the patient measure his/her own blood pressure or blood glucose at home?  YES blood pressure   Does the patient have any problems obtaining medications due to transportation or finances?   NO  Understanding of regimen: excellent Understanding of indications: excellent Potential of compliance: excellent    Comments: Has excellent understanding of prescribed medications and supplements taken. Only concern voiced is the number of medications he is now taking and hopeful with positive lifestyle modifications he will eventually be able to discontinue some of the medicines.    Ramon Dredge 06/23/2021 9:54 AM

## 2021-06-27 ENCOUNTER — Telehealth: Payer: Self-pay | Admitting: *Deleted

## 2021-06-27 ENCOUNTER — Other Ambulatory Visit: Payer: Self-pay

## 2021-06-27 ENCOUNTER — Encounter (HOSPITAL_COMMUNITY)
Admission: RE | Admit: 2021-06-27 | Discharge: 2021-06-27 | Disposition: A | Discharge status: Home / Self Care | Payer: Medicare Other | Source: Ambulatory Visit | Attending: Interventional Cardiology | Admitting: Interventional Cardiology

## 2021-06-27 DIAGNOSIS — Z955 Presence of coronary angioplasty implant and graft: Secondary | ICD-10-CM | POA: Diagnosis not present

## 2021-06-27 DIAGNOSIS — G4733 Obstructive sleep apnea (adult) (pediatric): Secondary | ICD-10-CM

## 2021-06-27 NOTE — Progress Notes (Signed)
Pt started cardiac rehab today.  Pt tolerated exercise without difficulty. VSS, telemetry-Sinus Rhythm, asymptomatic.  Medication list reconciled. Pt denies barriers to medicaiton compliance.  PSYCHOSOCIAL ASSESSMENT:  PHQ-0. Pt exhibits positive coping skills, hopeful outlook with supportive family. No psychosocial needs identified at this time, no psychosocial interventions necessary.   Pt oriented to exercise equipment and routine. Understanding verbalized.

## 2021-06-27 NOTE — Telephone Encounter (Addendum)
The patient has been notified of the result and verbalized understanding.  All questions (if any) were answered. Randy Hudson, [place] 06/27/2021 3:94 PM    Will precert titr.

## 2021-06-27 NOTE — Telephone Encounter (Signed)
-----   Message from Sueanne Margarita, MD sent at 06/19/2021  3:10 PM EDT ----- Please let patient know that they have sleep apnea.  Recommend therapeutic CPAP titration for treatment of patient's sleep disordered breathing.  If unable to perform an in lab titration then initiate ResMed auto CPAP from 4 to 15cm H2O with heated humidity and mask of choice and overnight pulse ox on CPAP.

## 2021-06-27 NOTE — Progress Notes (Signed)
Cardiac Individual Treatment Plan  Patient Details  Name: Randy Hudson MRN: [id number] Date of Birth: [birth year] Referring Provider:   Flowsheet Row CARDIAC REHAB PHASE II ORIENTATION from 06/23/2021 in [place]  Referring Provider dr. 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 Swallow whole., Disp: 90 tablet, Rfl: 3   atorvastatin (LIPITOR) 80 MG tablet, Take 80 mg by mouth in the morning., Disp: , Rfl:    carboxymethylcellul-glycerin (LUBRICANT DROPS/DUAL-ACTION) 0.5-0.9 % ophthalmic solution, Place 1 drop into both eyes 3 (three) times daily as needed (dry/irritated eyes.)., Disp: , Rfl:    Cholecalciferol (VITAMIN D) 50 MCG (2000 UT) tablet, Take 2,000 Units by mouth in the morning., Disp: , Rfl:    clopidogrel (PLAVIX) 75 MG tablet, Take 1 tablet (75 mg total) by mouth daily with breakfast., Disp: 90 tablet, Rfl: 3   ezetimibe (ZETIA) 10 MG tablet, Take 1 tablet (10 mg total) by mouth daily. (Patient not taking: Reported on 06/15/2021), Disp: 90 tablet, Rfl: 3   inclisiran (LEQVIO) 284 MG/1.5ML SOSY injection, Inject 284 mg into the skin as directed. Administered subcutaneously every 6 months, Disp: , Rfl:    metoprolol succinate (TOPROL XL) 25 MG 24 hr tablet, Take 1 tablet (25 mg total) by mouth daily. Take with or immediately following a meal., Disp: 90 tablet, Rfl: 1   Multiple Vitamins-Minerals (PRESERVISION AREDS 2+MULTI VIT PO), Take 1 capsule by mouth 2 (two) times  daily., Disp: , Rfl:    nitroGLYCERIN (NITROSTAT) 0.4 MG SL tablet, Place 1 tablet (0.4 mg total) under the tongue every 5 (five) minutes as needed for chest pain., Disp: 25 tablet, Rfl: 6   tobramycin (TOBREX) 0.3 % ophthalmic solution, Place 1 drop into the right eye as directed. Instill 1 drop into right eye 4 times daily the day before, the day of & the day following eylea intravitreal injection, Disp: , Rfl:    triamterene-hydrochlorothiazide (DYAZIDE) 37.5-25 MG capsule, Take 1 capsule by mouth every morning., Disp: , Rfl:   Past Medical History: Past Medical History:  Diagnosis Date   Hyperlipidemia    Hypertension     Tobacco Use: Social History   Tobacco Use  Smoking Status Never  Smokeless Tobacco Never    Labs: Recent Review Flowsheet Data   There is no flowsheet data to display.     Capillary Blood Glucose: No results found for: GLUCAP   Exercise Target Goals: Exercise Program Goal: Individual exercise prescription set using results from initial 6 min walk test and THRR while considering  patient's activity barriers and safety.   Exercise Prescription Goal: Initial exercise prescription builds to 30-45 minutes a day of aerobic activity, 2-3 days per week.  Home exercise guidelines will be given to patient during program as part of exercise prescription that the participant will acknowledge.  Activity Barriers & Risk Stratification:  Activity Barriers & Cardiac Risk Stratification - 06/23/21 1029       Activity Barriers &  Cardiac Risk Stratification   Activity Barriers Deconditioning    Cardiac Risk Stratification High             6 Minute Walk:  6 Minute Walk     Row Name 06/23/21 0951         6 Minute Walk   Phase Initial     Distance 1200 feet     Walk Time 6 minutes     # of Rest Breaks 0     MPH 2.27     METS 2.36     RPE 9     Perceived Dyspnea  0     VO2 Peak 8.26     Symptoms No     Resting HR 71 bpm     Resting BP 120/62      Resting Oxygen Saturation  97 %     Exercise Oxygen Saturation  during 6 min walk 98 %     Max Ex. HR 90 bpm     Max Ex. BP 122/72     2 Minute Post BP 120/70              Oxygen Initial Assessment:   Oxygen Re-Evaluation:   Oxygen Discharge (Final Oxygen Re-Evaluation):   Initial Exercise Prescription:  Initial Exercise Prescription - 06/23/21 0900       Date of Initial Exercise RX and Referring Provider   Date 06/23/21    Referring Provider dr. [name], MD    Expected Discharge Date 08/19/21      Recumbant Bike   Level 1.5    Minutes 15    METs 1.9      NuStep   Level 2    SPM 75    Minutes 15    METs 1.9      Prescription Details   Frequency (times per week) 3    Duration Progress to 30 minutes of continuous aerobic without signs/symptoms of physical distress      Intensity   THRR 40-80% of Max Heartrate 59-118    Ratings of Perceived Exertion 11-13    Perceived Dyspnea 0-4      Progression   Progression Continue progressive overload as per policy without signs/symptoms or physical distress.      Resistance Training   Training Prescription Yes    Weight 3    Reps 10-15             Perform Capillary Blood Glucose checks as needed.  Exercise Prescription Changes:   Exercise Prescription Changes     Row Name 06/27/21 [phone]             Response to Exercise   Blood Pressure (Admit) 114/68       Blood Pressure (Exercise) 138/68       Blood Pressure (Exit) 120/70       Heart Rate (Admit) 76 bpm       Heart Rate (Exercise) 93 bpm       Heart Rate (Exit) 85 bpm       Rating of Perceived Exertion (Exercise) 9       Perceived Dyspnea (Exercise) 0       Symptoms 0       Comments Pt first day of exercise       Duration Progress to 30 minutes of  aerobic without signs/symptoms of physical distress       Intensity THRR unchanged               Progression  Progression Continue to progress workloads to maintain intensity without  signs/symptoms of physical distress.       Average METs 2               Resistance Training   Training Prescription Yes       Weight 3       Reps 10-15       Time 10 Minutes               Recumbant Bike   Level 1.5       Minutes 15       METs 1.9               NuStep   Level 2       SPM 75       Minutes 15       METs 2.1                Exercise Comments:   Exercise Comments     Row Name 06/27/21 0827           Exercise Comments Pt first day of exercise in the [facility]2 program. Pt tolerated exercise well with an average MET level of 2.0. Pt is off to a good start. Will continue to monitor pt and progress workloads as tolerated without sign or symptom                Exercise Goals and Review:   Exercise Goals     Row Name 06/23/21 1030             Exercise Goals   Increase Physical Activity Yes       Intervention Provide advice, education, support and counseling about physical activity/exercise needs.;Develop an individualized exercise prescription for aerobic and resistive training based on initial evaluation findings, risk stratification, comorbidities and participant's personal goals.       Expected Outcomes Short Term: Attend rehab on a regular basis to increase amount of physical activity.;Long Term: Add in home exercise to make exercise part of routine and to increase amount of physical activity.;Long Term: Exercising regularly at least 3-5 days a week.       Increase Strength and Stamina Yes       Intervention Provide advice, education, support and counseling about physical activity/exercise needs.;Develop an individualized exercise prescription for aerobic and resistive training based on initial evaluation findings, risk stratification, comorbidities and participant's personal goals.       Expected Outcomes Short Term: Increase workloads from initial exercise prescription for resistance, speed, and METs.;Short Term: Perform resistance training exercises  routinely during rehab and add in resistance training at home;Long Term: Improve cardiorespiratory fitness, muscular endurance and strength as measured by increased METs and functional capacity (6MWT)       Able to understand and use rate of perceived exertion (RPE) scale Yes       Intervention Provide education and explanation on how to use RPE scale       Expected Outcomes Short Term: Able to use RPE daily in rehab to express subjective intensity level;Long Term:  Able to use RPE to guide intensity level when exercising independently       Knowledge and understanding of Target Heart Rate Range (THRR) Yes       Intervention Provide education and explanation of THRR including how the numbers were predicted and where they are located for reference       Expected Outcomes Short Term: Able to state/look up THRR;Long Term: Able  to use THRR to govern intensity when exercising independently;Short Term: Able to use daily as guideline for intensity in rehab       Understanding of Exercise Prescription Yes       Intervention Provide education, explanation, and written materials on patient's individual exercise prescription       Expected Outcomes Short Term: Able to explain program exercise prescription;Long Term: Able to explain home exercise prescription to exercise independently                Exercise Goals Re-Evaluation :  Exercise Goals Re-Evaluation     Row Name 06/27/21 0825             Exercise Goal Re-Evaluation   Exercise Goals Review Increase Physical Activity;Increase Strength and Stamina;Able to understand and use rate of perceived exertion (RPE) scale;Knowledge and understanding of Target Heart Rate Range (THRR);Understanding of Exercise Prescription       Comments Pt first day of exercise in the CRP2 program. Pt tolerated exercise well with an average MET level of 2.0. Pt is off to a good start       Expected Outcomes Will continue to monitor pt and progress workloads as tolerated  without sign or symptom                Discharge Exercise Prescription (Final Exercise Prescription Changes):  Exercise Prescription Changes - 06/27/21 0821       Response to Exercise   Blood Pressure (Admit) 114/68    Blood Pressure (Exercise) 138/68    Blood Pressure (Exit) 120/70    Heart Rate (Admit) 76 bpm    Heart Rate (Exercise) 93 bpm    Heart Rate (Exit) 85 bpm    Rating of Perceived Exertion (Exercise) 9    Perceived Dyspnea (Exercise) 0    Symptoms 0    Comments Pt first day of exercise    Duration Progress to 30 minutes of  aerobic without signs/symptoms of physical distress    Intensity THRR unchanged      Progression   Progression Continue to progress workloads to maintain intensity without signs/symptoms of physical distress.    Average METs 2      Resistance Training   Training Prescription Yes    Weight 3    Reps 10-15    Time 10 Minutes      Recumbant Bike   Level 1.5    Minutes 15    METs 1.9      NuStep   Level 2    SPM 75    Minutes 15    METs 2.1             Nutrition:  Target Goals: Understanding of nutrition guidelines, daily intake of sodium <1580m, cholesterol <2032m calories 30% from fat and 7% or less from saturated fats, daily to have 5 or more servings of fruits and vegetables.  Biometrics:  Pre Biometrics - 06/23/21 1028       Pre Biometrics   Waist Circumference 39 inches    Hip Circumference 38.5 inches    Waist to Hip Ratio 1.01 %    Triceps Skinfold 13 mm    % Body Fat 27 %    Grip Strength 42 kg    Flexibility --   pt unable to reach   Single Leg Stand 4.4 seconds              Nutrition Therapy Plan and Nutrition Goals:   Nutrition Assessments:  MEDIFICTS Score Key: ?7060  Need to make dietary changes  40-70 Heart Healthy Diet ? 40 Therapeutic Level Cholesterol Diet    Picture Your Plate Scores: <37 Unhealthy dietary pattern with much room for improvement. 41-50 Dietary pattern unlikely to  meet recommendations for good health and room for improvement. 51-60 More healthful dietary pattern, with some room for improvement.  >60 Healthy dietary pattern, although there may be some specific behaviors that could be improved.    Nutrition Goals Re-Evaluation:   Nutrition Goals Re-Evaluation:   Nutrition Goals Discharge (Final Nutrition Goals Re-Evaluation):   Psychosocial: Target Goals: Acknowledge presence or absence of significant depression and/or stress, maximize coping skills, provide positive support system. Participant is able to verbalize types and ability to use techniques and skills needed for reducing stress and depression.  Initial Review & Psychosocial Screening:  Initial Psych Review & Screening - 06/23/21 1015       Initial Review   Current issues with None Identified      Family Dynamics   Good Support System? Yes    Comments Patient is married and has good support. Demonstrates positive coping skills and willingness to work on healthy lifestyle changes to improve health. He has not been a formal exerciser. An activity he enjoys is cleaning and waxing his muscle cars in his garage.      Barriers   Psychosocial barriers to participate in program There are no identifiable barriers or psychosocial needs.      Screening Interventions   Interventions Encouraged to exercise    Expected Outcomes Short Term goal: Identification and review with participant of any Quality of Life or Depression concerns found by scoring the questionnaire.   No QOL concerns identified. No depression concerns            Quality of Life Scores:  Quality of Life - 06/23/21 1037       Quality of Life   Select Quality of Life      Quality of Life Scores   Health/Function Pre 28 %    Socioeconomic Pre 29.29 %    Psych/Spiritual Pre 30 %    Family Pre 27.6 %    GLOBAL Pre 28.62 %            Scores of 19 and below usually indicate a poorer quality of life in these areas.   A difference of  2-3 points is a clinically meaningful difference.  A difference of 2-3 points in the total score of the Quality of Life Index has been associated with significant improvement in overall quality of life, self-image, physical symptoms, and general health in studies assessing change in quality of life.  PHQ-9: Recent Review Flowsheet Data     Depression screen [facility] 2/9 06/23/2021   Decreased Interest 0   Down, Depressed, Hopeless 0   PHQ - 2 Score 0      Interpretation of Total Score  Total Score Depression Severity:  1-4 = Minimal depression, 5-9 = Mild depression, 10-14 = Moderate depression, 15-19 = Moderately severe depression, 20-27 = Severe depression   Psychosocial Evaluation and Intervention:   Psychosocial Re-Evaluation:   Psychosocial Discharge (Final Psychosocial Re-Evaluation):   Vocational Rehabilitation: Provide vocational rehab assistance to qualifying candidates.   Vocational Rehab Evaluation & Intervention:   Education: Education Goals: Education classes will be provided on a weekly basis, covering required topics. Participant will state understanding/return demonstration of topics presented.  Learning Barriers/Preferences:  Learning Barriers/Preferences - 06/23/21 1038       Learning Barriers/Preferences  Learning Barriers Sight;Hearing   Hearing aides, Macular edema right eye, glasses.   Learning Preferences Audio;Computer/Internet;Group Instruction;Individual Instruction;Pictoral;Skilled Demonstration;Verbal Instruction;Video;Written Material             Education Topics: Count Your Pulse:  -Group instruction provided by verbal instruction, demonstration, patient participation and written materials to support subject.  Instructors address importance of being able to find your pulse and how to count your pulse when at home without a heart monitor.  Patients get hands on experience counting their pulse with staff help and  individually.   Heart Attack, Angina, and Risk Factor Modification:  -Group instruction provided by verbal instruction, video, and written materials to support subject.  Instructors address signs and symptoms of angina and heart attacks.    Also discuss risk factors for heart disease and how to make changes to improve heart health risk factors.   Functional Fitness:  -Group instruction provided by verbal instruction, demonstration, patient participation, and written materials to support subject.  Instructors address safety measures for doing things around the house.  Discuss how to get up and down off the floor, how to pick things up properly, how to safely get out of a chair without assistance, and balance training.   Meditation and Mindfulness:  -Group instruction provided by verbal instruction, patient participation, and written materials to support subject.  Instructor addresses importance of mindfulness and meditation practice to help reduce stress and improve awareness.  Instructor also leads participants through a meditation exercise.    Stretching for Flexibility and Mobility:  -Group instruction provided by verbal instruction, patient participation, and written materials to support subject.  Instructors lead participants through series of stretches that are designed to increase flexibility thus improving mobility.  These stretches are additional exercise for major muscle groups that are typically performed during regular warm up and cool down.   Hands Only CPR:  -Group verbal, video, and participation provides a basic overview of AHA guidelines for community CPR. Role-play of emergencies allow participants the opportunity to practice calling for help and chest compression technique with discussion of AED use.   Hypertension: -Group verbal and written instruction that provides a basic overview of hypertension including the most recent diagnostic guidelines, risk factor reduction with  self-care instructions and medication management.    Nutrition I class: Heart Healthy Eating:  -Group instruction provided by PowerPoint slides, verbal discussion, and written materials to support subject matter. The instructor gives an explanation and review of the Therapeutic Lifestyle Changes diet recommendations, which includes a discussion on lipid goals, dietary fat, sodium, fiber, plant stanol/sterol esters, sugar, and the components of a well-balanced, healthy diet.   Nutrition II class: Lifestyle Skills:  -Group instruction provided by PowerPoint slides, verbal discussion, and written materials to support subject matter. The instructor gives an explanation and review of label reading, grocery shopping for heart health, heart healthy recipe modifications, and ways to make healthier choices when eating out.   Diabetes Question & Answer:  -Group instruction provided by PowerPoint slides, verbal discussion, and written materials to support subject matter. The instructor gives an explanation and review of diabetes co-morbidities, pre- and post-prandial blood glucose goals, pre-exercise blood glucose goals, signs, symptoms, and treatment of hypoglycemia and hyperglycemia, and foot care basics.   Diabetes Blitz:  -Group instruction provided by PowerPoint slides, verbal discussion, and written materials to support subject matter. The instructor gives an explanation and review of the physiology behind type 1 and type 2 diabetes, diabetes medications and rational behind using different  medications, pre- and post-prandial blood glucose recommendations and Hemoglobin A1c goals, diabetes diet, and exercise including blood glucose guidelines for exercising safely.    Portion Distortion:  -Group instruction provided by PowerPoint slides, verbal discussion, written materials, and food models to support subject matter. The instructor gives an explanation of serving size versus portion size, changes in  portions sizes over the last 20 years, and what consists of a serving from each food group.   Stress Management:  -Group instruction provided by verbal instruction, video, and written materials to support subject matter.  Instructors review role of stress in heart disease and how to cope with stress positively.     Exercising on Your Own:  -Group instruction provided by verbal instruction, power point, and written materials to support subject.  Instructors discuss benefits of exercise, components of exercise, frequency and intensity of exercise, and end points for exercise.  Also discuss use of nitroglycerin and activating EMS.  Review options of places to exercise outside of rehab.  Review guidelines for sex with heart disease.   Cardiac Drugs I:  -Group instruction provided by verbal instruction and written materials to support subject.  Instructor reviews cardiac drug classes: antiplatelets, anticoagulants, beta blockers, and statins.  Instructor discusses reasons, side effects, and lifestyle considerations for each drug class.   Cardiac Drugs II:  -Group instruction provided by verbal instruction and written materials to support subject.  Instructor reviews cardiac drug classes: angiotensin converting enzyme inhibitors (ACE-I), angiotensin II receptor blockers (ARBs), nitrates, and calcium channel blockers.  Instructor discusses reasons, side effects, and lifestyle considerations for each drug class.   Anatomy and Physiology of the Circulatory System:  Group verbal and written instruction and models provide basic cardiac anatomy and physiology, with the coronary electrical and arterial systems. Review of: AMI, Angina, Valve disease, Heart Failure, Peripheral Artery Disease, Cardiac Arrhythmia, Pacemakers, and the ICD.   Other Education:  -Group or individual verbal, written, or video instructions that support the educational goals of the cardiac rehab program.   Holiday Eating Survival  Tips:  -Group instruction provided by PowerPoint slides, verbal discussion, and written materials to support subject matter. The instructor gives patients tips, tricks, and techniques to help them not only survive but enjoy the holidays despite the onslaught of food that accompanies the holidays.   Knowledge Questionnaire Score:  Knowledge Questionnaire Score - 06/23/21 1040       Knowledge Questionnaire Score   Pre Score 22/24             Core Components/Risk Factors/Patient Goals at Admission:  Personal Goals and Risk Factors at Admission - 06/23/21 1043       Core Components/Risk Factors/Patient Goals on Admission    Weight Management Yes;Weight Loss    Intervention Weight Management: Develop a combined nutrition and exercise program designed to reach desired caloric intake, while maintaining appropriate intake of nutrient and fiber, sodium and fats, and appropriate energy expenditure required for the weight goal.;Weight Management: Provide education and appropriate resources to help participant work on and attain dietary goals.;Weight Management/Obesity: Establish reasonable short term and long term weight goals.;Obesity: Provide education and appropriate resources to help participant work on and attain dietary goals.    Admit Weight 173 lb 4.5 oz (78.6 kg)    Expected Outcomes Short Term: Continue to assess and modify interventions until short term weight is achieved;Long Term: Adherence to nutrition and physical activity/exercise program aimed toward attainment of established weight goal;Understanding recommendations for meals to include 15-35% energy as  protein, 25-35% energy from fat, 35-60% energy from carbohydrates, less than 251m of dietary cholesterol, 20-35 gm of total fiber daily;Understanding of distribution of calorie intake throughout the day with the consumption of 4-5 meals/snacks    Hypertension Yes    Intervention Provide education on lifestyle modifcations including  regular physical activity/exercise, weight management, moderate sodium restriction and increased consumption of fresh fruit, vegetables, and low fat dairy, alcohol moderation, and smoking cessation.;Monitor prescription use compliance.    Expected Outcomes Short Term: Continued assessment and intervention until BP is < 140/955mHG in hypertensive participants. < 130/8022mG in hypertensive participants with diabetes, heart failure or chronic kidney disease.;Long Term: Maintenance of blood pressure at goal levels.    Lipids Yes    Intervention Provide education and support for participant on nutrition & aerobic/resistive exercise along with prescribed medications to achieve LDL <70m76mDL >40mg59m Expected Outcomes Short Term: Participant states understanding of desired cholesterol values and is compliant with medications prescribed. Participant is following exercise prescription and nutrition guidelines.;Long Term: Cholesterol controlled with medications as prescribed, with individualized exercise RX and with personalized nutrition plan. Value goals: LDL < 70mg,88m > 40 mg.    Personal Goal Other Yes    Personal Goal Short term: walk more and be healthier. Long term: gain strength, energy and lose wt    Intervention Will continue to monitor pt and progress workloads as tolerated without sign or symptom.    Expected Outcomes Pt will achieve his goals             Core Components/Risk Factors/Patient Goals Review:    Core Components/Risk Factors/Patient Goals at Discharge (Final Review):    ITP Comments:  ITP Comments     Row Name 06/27/21 1543           ITP Comments Dr. Traci Fransico Himcal Director.  30-day ITP review period. Dora had his first session of exercise on 06-27-2021 and tolerated exercise very well. He has a very positive outlook. Cardiac rehab staff will continue to work with Rocco tJenny Reichmannsist in meeting his health, fitness and wellness goals                Comments:  See ITP comments.

## 2021-06-28 DIAGNOSIS — H353122 Nonexudative age-related macular degeneration, left eye, intermediate dry stage: Secondary | ICD-10-CM | POA: Diagnosis not present

## 2021-06-28 DIAGNOSIS — H3561 Retinal hemorrhage, right eye: Secondary | ICD-10-CM | POA: Diagnosis not present

## 2021-06-28 DIAGNOSIS — H353111 Nonexudative age-related macular degeneration, right eye, early dry stage: Secondary | ICD-10-CM | POA: Diagnosis not present

## 2021-06-28 DIAGNOSIS — H34831 Tributary (branch) retinal vein occlusion, right eye, with macular edema: Secondary | ICD-10-CM | POA: Diagnosis not present

## 2021-06-29 ENCOUNTER — Encounter (HOSPITAL_COMMUNITY)
Admission: RE | Admit: 2021-06-29 | Discharge: 2021-06-29 | Disposition: A | Discharge status: Home / Self Care | Payer: Medicare Other | Source: Ambulatory Visit | Attending: Interventional Cardiology | Admitting: Interventional Cardiology

## 2021-06-29 ENCOUNTER — Other Ambulatory Visit: Payer: Self-pay

## 2021-06-29 DIAGNOSIS — Z955 Presence of coronary angioplasty implant and graft: Secondary | ICD-10-CM | POA: Diagnosis not present

## 2021-07-01 ENCOUNTER — Other Ambulatory Visit: Payer: Self-pay

## 2021-07-01 ENCOUNTER — Encounter (HOSPITAL_COMMUNITY)
Admission: RE | Admit: 2021-07-01 | Discharge: 2021-07-01 | Disposition: A | Discharge status: Home / Self Care | Payer: Medicare Other | Source: Ambulatory Visit | Attending: Interventional Cardiology | Admitting: Interventional Cardiology

## 2021-07-01 DIAGNOSIS — Z955 Presence of coronary angioplasty implant and graft: Secondary | ICD-10-CM

## 2021-07-04 ENCOUNTER — Other Ambulatory Visit: Payer: Self-pay

## 2021-07-04 ENCOUNTER — Encounter (HOSPITAL_COMMUNITY)
Admission: RE | Admit: 2021-07-04 | Discharge: 2021-07-04 | Disposition: A | Discharge status: Home / Self Care | Payer: Medicare Other | Source: Ambulatory Visit | Attending: Interventional Cardiology | Admitting: Interventional Cardiology

## 2021-07-04 DIAGNOSIS — Z955 Presence of coronary angioplasty implant and graft: Secondary | ICD-10-CM

## 2021-07-06 ENCOUNTER — Encounter (HOSPITAL_COMMUNITY): Payer: Medicare Other

## 2021-07-06 DIAGNOSIS — I10 Essential (primary) hypertension: Secondary | ICD-10-CM | POA: Diagnosis not present

## 2021-07-06 DIAGNOSIS — E7800 Pure hypercholesterolemia, unspecified: Secondary | ICD-10-CM | POA: Diagnosis not present

## 2021-07-06 DIAGNOSIS — I2510 Atherosclerotic heart disease of native coronary artery without angina pectoris: Secondary | ICD-10-CM | POA: Diagnosis not present

## 2021-07-06 DIAGNOSIS — E663 Overweight: Secondary | ICD-10-CM | POA: Diagnosis not present

## 2021-07-06 DIAGNOSIS — Z955 Presence of coronary angioplasty implant and graft: Secondary | ICD-10-CM | POA: Diagnosis not present

## 2021-07-06 DIAGNOSIS — Z23 Encounter for immunization: Secondary | ICD-10-CM | POA: Diagnosis not present

## 2021-07-08 ENCOUNTER — Encounter (HOSPITAL_COMMUNITY)
Admission: RE | Admit: 2021-07-08 | Discharge: 2021-07-08 | Disposition: A | Discharge status: Home / Self Care | Payer: Medicare Other | Source: Ambulatory Visit | Attending: Interventional Cardiology | Admitting: Interventional Cardiology

## 2021-07-08 ENCOUNTER — Other Ambulatory Visit: Payer: Self-pay

## 2021-07-08 DIAGNOSIS — Z955 Presence of coronary angioplasty implant and graft: Secondary | ICD-10-CM

## 2021-07-11 ENCOUNTER — Encounter (HOSPITAL_COMMUNITY)
Admission: RE | Admit: 2021-07-11 | Discharge: 2021-07-11 | Disposition: A | Discharge status: Home / Self Care | Payer: Medicare Other | Source: Ambulatory Visit | Attending: Interventional Cardiology | Admitting: Interventional Cardiology

## 2021-07-11 ENCOUNTER — Other Ambulatory Visit: Payer: Self-pay

## 2021-07-11 DIAGNOSIS — Z955 Presence of coronary angioplasty implant and graft: Secondary | ICD-10-CM | POA: Diagnosis not present

## 2021-07-13 ENCOUNTER — Other Ambulatory Visit: Payer: Self-pay

## 2021-07-13 ENCOUNTER — Encounter (HOSPITAL_COMMUNITY)
Admission: RE | Admit: 2021-07-13 | Discharge: 2021-07-13 | Disposition: A | Discharge status: Home / Self Care | Payer: Medicare Other | Source: Ambulatory Visit | Attending: Interventional Cardiology | Admitting: Interventional Cardiology

## 2021-07-13 DIAGNOSIS — Z955 Presence of coronary angioplasty implant and graft: Secondary | ICD-10-CM | POA: Diagnosis not present

## 2021-07-13 NOTE — Progress Notes (Signed)
CARDIAC REHAB PHASE 2  Reviewed home exercise with pt today. Pt is tolerating exercise well. Pt will continue to exercise on their own by walking for 30-45 minutes per session 2 days a week in addition to the 3 days in [facility]. Advised pt on THRR, RPE scale, hydration and temperature/humidity precautions. Reinforced NTG use, S/S to stop exercise and when to call MD vs 911. Encouraged warm up cool down and stretches with exercise sessions. Pt verbalized understanding, all questions were answered and pt was given a copy to take home.    Kirby Funk ACSM-EP 07/13/2021 8:25 AM

## 2021-07-15 ENCOUNTER — Encounter (HOSPITAL_COMMUNITY)
Admission: RE | Admit: 2021-07-15 | Discharge: 2021-07-15 | Disposition: A | Discharge status: Home / Self Care | Payer: Medicare Other | Source: Ambulatory Visit | Attending: Interventional Cardiology | Admitting: Interventional Cardiology

## 2021-07-15 ENCOUNTER — Other Ambulatory Visit: Payer: Self-pay

## 2021-07-15 DIAGNOSIS — Z955 Presence of coronary angioplasty implant and graft: Secondary | ICD-10-CM | POA: Diagnosis not present

## 2021-07-18 ENCOUNTER — Encounter (HOSPITAL_COMMUNITY)
Admission: RE | Admit: 2021-07-18 | Discharge: 2021-07-18 | Disposition: A | Discharge status: Home / Self Care | Payer: Medicare Other | Source: Ambulatory Visit | Attending: Interventional Cardiology | Admitting: Interventional Cardiology

## 2021-07-18 ENCOUNTER — Other Ambulatory Visit: Payer: Self-pay

## 2021-07-18 DIAGNOSIS — Z955 Presence of coronary angioplasty implant and graft: Secondary | ICD-10-CM

## 2021-07-20 ENCOUNTER — Other Ambulatory Visit: Payer: Self-pay

## 2021-07-20 ENCOUNTER — Encounter (HOSPITAL_COMMUNITY)
Admission: RE | Admit: 2021-07-20 | Discharge: 2021-07-20 | Disposition: A | Discharge status: Home / Self Care | Payer: Medicare Other | Source: Ambulatory Visit | Attending: Interventional Cardiology | Admitting: Interventional Cardiology

## 2021-07-20 DIAGNOSIS — Z955 Presence of coronary angioplasty implant and graft: Secondary | ICD-10-CM | POA: Insufficient documentation

## 2021-07-20 DIAGNOSIS — G4733 Obstructive sleep apnea (adult) (pediatric): Secondary | ICD-10-CM | POA: Diagnosis not present

## 2021-07-20 NOTE — Progress Notes (Signed)
Cardiac Individual Treatment Plan  Patient Details  Name: Randy Hudson MRN: [id number] Date of Birth: [birth year] Referring Provider:   Flowsheet Row CARDIAC REHAB PHASE II ORIENTATION from 06/23/2021 in [place]  Referring Provider dr. 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 Swallow whole., Disp: 90 tablet, Rfl: 3   atorvastatin (LIPITOR) 80 MG tablet, Take 80 mg by mouth in the morning., Disp: , Rfl:    carboxymethylcellul-glycerin (LUBRICANT DROPS/DUAL-ACTION) 0.5-0.9 % ophthalmic solution, Place 1 drop into both eyes 3 (three) times daily as needed (dry/irritated eyes.)., Disp: , Rfl:    Cholecalciferol (VITAMIN D) 50 MCG (2000 UT) tablet, Take 2,000 Units by mouth in the morning., Disp: , Rfl:    clopidogrel (PLAVIX) 75 MG tablet, Take 1 tablet (75 mg total) by mouth daily with breakfast., Disp: 90 tablet, Rfl: 3   ezetimibe (ZETIA) 10 MG tablet, Take 1 tablet (10 mg total) by mouth daily. (Patient not taking: Reported on 06/15/2021), Disp: 90 tablet, Rfl: 3   inclisiran (LEQVIO) 284 MG/1.5ML SOSY injection, Inject 284 mg into the skin as directed. Administered subcutaneously every 6 months, Disp: , Rfl:    metoprolol succinate (TOPROL XL) 25 MG 24 hr tablet, Take 1 tablet (25 mg total) by mouth daily. Take with or immediately following a meal., Disp: 90 tablet, Rfl: 1   Multiple Vitamins-Minerals (PRESERVISION AREDS 2+MULTI VIT PO), Take 1 capsule by mouth 2 (two) times  daily., Disp: , Rfl:    nitroGLYCERIN (NITROSTAT) 0.4 MG SL tablet, Place 1 tablet (0.4 mg total) under the tongue every 5 (five) minutes as needed for chest pain., Disp: 25 tablet, Rfl: 6   tobramycin (TOBREX) 0.3 % ophthalmic solution, Place 1 drop into the right eye as directed. Instill 1 drop into right eye 4 times daily the day before, the day of & the day following eylea intravitreal injection, Disp: , Rfl:    triamterene-hydrochlorothiazide (DYAZIDE) 37.5-25 MG capsule, Take 1 capsule by mouth every morning., Disp: , Rfl:   Past Medical History: Past Medical History:  Diagnosis Date   Hyperlipidemia    Hypertension     Tobacco Use: Social History   Tobacco Use  Smoking Status Never  Smokeless Tobacco Never    Labs: Recent Review Flowsheet Data   There is no flowsheet data to display.     Capillary Blood Glucose: No results found for: GLUCAP   Exercise Target Goals: Exercise Program Goal: Individual exercise prescription set using results from initial 6 min walk test and THRR while considering  patient's activity barriers and safety.   Exercise Prescription Goal: Initial exercise prescription builds to 30-45 minutes a day of aerobic activity, 2-3 days per week.  Home exercise guidelines will be given to patient during program as part of exercise prescription that the participant will acknowledge.  Activity Barriers & Risk Stratification:  Activity Barriers & Cardiac Risk Stratification - 06/23/21 1029       Activity Barriers &  Cardiac Risk Stratification   Activity Barriers Deconditioning    Cardiac Risk Stratification High             6 Minute Walk:  6 Minute Walk     Row Name 06/23/21 0951         6 Minute Walk   Phase Initial     Distance 1200 feet     Walk Time 6 minutes     # of Rest Breaks 0     MPH 2.27     METS 2.36     RPE 9     Perceived Dyspnea  0     VO2 Peak 8.26     Symptoms No     Resting HR 71 bpm     Resting BP 120/62      Resting Oxygen Saturation  97 %     Exercise Oxygen Saturation  during 6 min walk 98 %     Max Ex. HR 90 bpm     Max Ex. BP 122/72     2 Minute Post BP 120/70              Oxygen Initial Assessment:   Oxygen Re-Evaluation:   Oxygen Discharge (Final Oxygen Re-Evaluation):   Initial Exercise Prescription:  Initial Exercise Prescription - 06/23/21 0900       Date of Initial Exercise RX and Referring Provider   Date 06/23/21    Referring Provider dr. [name], MD    Expected Discharge Date 08/19/21      Recumbant Bike   Level 1.5    Minutes 15    METs 1.9      NuStep   Level 2    SPM 75    Minutes 15    METs 1.9      Prescription Details   Frequency (times per week) 3    Duration Progress to 30 minutes of continuous aerobic without signs/symptoms of physical distress      Intensity   THRR 40-80% of Max Heartrate 59-118    Ratings of Perceived Exertion 11-13    Perceived Dyspnea 0-4      Progression   Progression Continue progressive overload as per policy without signs/symptoms or physical distress.      Resistance Training   Training Prescription Yes    Weight 3    Reps 10-15             Perform Capillary Blood Glucose checks as needed.  Exercise Prescription Changes:   Exercise Prescription Changes     Row Name 06/27/21 [phone] 07/13/21 0814           Response to Exercise   Blood Pressure (Admit) 114/68 110/62      Blood Pressure (Exercise) 138/68 130/64      Blood Pressure (Exit) 120/70 108/64      Heart Rate (Admit) 76 bpm 92 bpm      Heart Rate (Exercise) 93 bpm 98 bpm      Heart Rate (Exit) 85 bpm 87 bpm      Rating of Perceived Exertion (Exercise) 9 11.5      Perceived Dyspnea (Exercise) 0 0      Symptoms 0 0      Comments Pt first day of exercise Reviewed MET, goals and home ExRx      Duration Progress to 30 minutes of  aerobic without signs/symptoms of physical distress Progress to 30 minutes of  aerobic without  signs/symptoms of physical distress  Intensity THRR unchanged THRR unchanged        Progression   Progression Continue to progress workloads to maintain intensity without signs/symptoms of physical distress. Continue to progress workloads to maintain intensity without signs/symptoms of physical distress.      Average METs 2 2.15        Resistance Training   Training Prescription Yes No      Weight 3 --      Reps 10-15 --      Time 10 Minutes --        Recumbant Bike   Level 1.5 2      Minutes 15 15      METs 1.9 2.1        NuStep   Level 2 2      SPM 75 75      Minutes 15 15      METs 2.1 2.2        Home Exercise Plan   Plans to continue exercise at -- Home (comment)      Frequency -- Add 2 additional days to program exercise sessions.      Initial Home Exercises Provided -- 07/13/21               Exercise Comments:   Exercise Comments     Row Name 06/27/21 0827 07/13/21 0823         Exercise Comments Pt first day of exercise in the CRP2 program. Pt tolerated exercise well with an average MET level of 2.0. Pt is off to a good start. Will continue to monitor pt and progress workloads as tolerated without sign or symptom Reviewed MET's, goals and home Ex Rx. Pt tolerated exercise well with an average MET level of 2.15. Pt feels like he is getting stronger and has more energy since starting CRP2. He states that he is also able to walk longer distances and was able to be a chaperone for a field trip with his family yesterday. He will start walking 2 days a week for 30-45 minutes a session to help increase his cardiorespiratory fitness.               Exercise Goals and Review:   Exercise Goals     Row Name 06/23/21 1030             Exercise Goals   Increase Physical Activity Yes       Intervention Provide advice, education, support and counseling about physical activity/exercise needs.;Develop an individualized exercise prescription for aerobic and  resistive training based on initial evaluation findings, risk stratification, comorbidities and participant's personal goals.       Expected Outcomes Short Term: Attend rehab on a regular basis to increase amount of physical activity.;Long Term: Add in home exercise to make exercise part of routine and to increase amount of physical activity.;Long Term: Exercising regularly at least 3-5 days a week.       Increase Strength and Stamina Yes       Intervention Provide advice, education, support and counseling about physical activity/exercise needs.;Develop an individualized exercise prescription for aerobic and resistive training based on initial evaluation findings, risk stratification, comorbidities and participant's personal goals.       Expected Outcomes Short Term: Increase workloads from initial exercise prescription for resistance, speed, and METs.;Short Term: Perform resistance training exercises routinely during rehab and add in resistance training at home;Long Term: Improve cardiorespiratory fitness, muscular endurance and strength as measured by increased METs and functional capacity (6MWT)  Able to understand and use rate of perceived exertion (RPE) scale Yes       Intervention Provide education and explanation on how to use RPE scale       Expected Outcomes Short Term: Able to use RPE daily in rehab to express subjective intensity level;Long Term:  Able to use RPE to guide intensity level when exercising independently       Knowledge and understanding of Target Heart Rate Range (THRR) Yes       Intervention Provide education and explanation of THRR including how the numbers were predicted and where they are located for reference       Expected Outcomes Short Term: Able to state/look up THRR;Long Term: Able to use THRR to govern intensity when exercising independently;Short Term: Able to use daily as guideline for intensity in rehab       Understanding of Exercise Prescription Yes        Intervention Provide education, explanation, and written materials on patient's individual exercise prescription       Expected Outcomes Short Term: Able to explain program exercise prescription;Long Term: Able to explain home exercise prescription to exercise independently                Exercise Goals Re-Evaluation :  Exercise Goals Re-Evaluation     Row Name 06/27/21 0825 07/13/21 0818 07/20/21 0818         Exercise Goal Re-Evaluation   Exercise Goals Review Increase Physical Activity;Increase Strength and Stamina;Able to understand and use rate of perceived exertion (RPE) scale;Knowledge and understanding of Target Heart Rate Range (THRR);Understanding of Exercise Prescription Increase Physical Activity;Increase Strength and Stamina;Able to understand and use rate of perceived exertion (RPE) scale;Knowledge and understanding of Target Heart Rate Range (THRR);Understanding of Exercise Prescription Increase Physical Activity;Increase Strength and Stamina;Able to understand and use rate of perceived exertion (RPE) scale;Knowledge and understanding of Target Heart Rate Range (THRR);Understanding of Exercise Prescription     Comments Pt first day of exercise in the CRP2 program. Pt tolerated exercise well with an average MET level of 2.0. Pt is off to a good start Reviewed MET's, goals and home Ex Rx. Pt tolerated exercise well with an average MET level of 2.15. Pt feels like he is getting stronger and has more energy since starting CRP2. He states that he is also able to walk longer distances and was able to be a chaperone for a field trip with his family yesterday. He will start walking 2 days a week for 30-45 minutes a session to help increase his cardiorespiratory fitness. Patient making good progress with exercise. Patient would like to achieve 3.0 METs. Increased workload on recumbent bike from 2.0 to 2.5, and patient tolerated well. Patient would like to increase to from 2.5 to 3.0 on Friday.      Expected Outcomes Will continue to monitor pt and progress workloads as tolerated without sign or symptom Pt will add in 2 days walking for 30-45 minutes per session. Will continue to monitor pt and progress workloads as tolerated without sign or symptom Continue to progress workloads as tolerated to help increase MET level with exercise.              Discharge Exercise Prescription (Final Exercise Prescription Changes):  Exercise Prescription Changes - 07/13/21 0814       Response to Exercise   Blood Pressure (Admit) 110/62    Blood Pressure (Exercise) 130/64    Blood Pressure (Exit) 108/64    Heart Rate (Admit)  92 bpm    Heart Rate (Exercise) 98 bpm    Heart Rate (Exit) 87 bpm    Rating of Perceived Exertion (Exercise) 11.5    Perceived Dyspnea (Exercise) 0    Symptoms 0    Comments Reviewed MET, goals and home ExRx    Duration Progress to 30 minutes of  aerobic without signs/symptoms of physical distress    Intensity THRR unchanged      Progression   Progression Continue to progress workloads to maintain intensity without signs/symptoms of physical distress.    Average METs 2.15      Resistance Training   Training Prescription No      Recumbant Bike   Level 2    Minutes 15    METs 2.1      NuStep   Level 2    SPM 75    Minutes 15    METs 2.2      Home Exercise Plan   Plans to continue exercise at Home (comment)    Frequency Add 2 additional days to program exercise sessions.    Initial Home Exercises Provided 07/13/21             Nutrition:  Target Goals: Understanding of nutrition guidelines, daily intake of sodium '1500mg'$ , cholesterol '200mg'$ , calories 30% from fat and 7% or less from saturated fats, daily to have 5 or more servings of fruits and vegetables.  Biometrics:  Pre Biometrics - 06/23/21 1028       Pre Biometrics   Waist Circumference 39 inches    Hip Circumference 38.5 inches    Waist to Hip Ratio 1.01 %    Triceps Skinfold 13 mm     % Body Fat 27 %    Grip Strength 42 kg    Flexibility --   pt unable to reach   Single Leg Stand 4.4 seconds              Nutrition Therapy Plan and Nutrition Goals:   Nutrition Assessments:  MEDIFICTS Score Key: ?70 Need to make dietary changes  40-70 Heart Healthy Diet ? 40 Therapeutic Level Cholesterol Diet    Picture Your Plate Scores: <00 Unhealthy dietary pattern with much room for improvement. 41-50 Dietary pattern unlikely to meet recommendations for good health and room for improvement. 51-60 More healthful dietary pattern, with some room for improvement.  >60 Healthy dietary pattern, although there may be some specific behaviors that could be improved.    Nutrition Goals Re-Evaluation:   Nutrition Goals Re-Evaluation:   Nutrition Goals Discharge (Final Nutrition Goals Re-Evaluation):   Psychosocial: Target Goals: Acknowledge presence or absence of significant depression and/or stress, maximize coping skills, provide positive support system. Participant is able to verbalize types and ability to use techniques and skills needed for reducing stress and depression.  Initial Review & Psychosocial Screening:  Initial Psych Review & Screening - 06/23/21 1015       Initial Review   Current issues with None Identified      Family Dynamics   Good Support System? Yes    Comments Patient is married and has good support. Demonstrates positive coping skills and willingness to work on healthy lifestyle changes to improve health. He has not been a formal exerciser. An activity he enjoys is cleaning and waxing his muscle cars in his garage.      Barriers   Psychosocial barriers to participate in program There are no identifiable barriers or psychosocial needs.      Screening Interventions  Interventions Encouraged to exercise    Expected Outcomes Short Term goal: Identification and review with participant of any Quality of Life or Depression concerns found by  scoring the questionnaire.   No QOL concerns identified. No depression concerns            Quality of Life Scores:  Quality of Life - 06/23/21 1037       Quality of Life   Select Quality of Life      Quality of Life Scores   Health/Function Pre 28 %    Socioeconomic Pre 29.29 %    Psych/Spiritual Pre 30 %    Family Pre 27.6 %    GLOBAL Pre 28.62 %            Scores of 19 and below usually indicate a poorer quality of life in these areas.  A difference of  2-3 points is a clinically meaningful difference.  A difference of 2-3 points in the total score of the Quality of Life Index has been associated with significant improvement in overall quality of life, self-image, physical symptoms, and general health in studies assessing change in quality of life.  PHQ-9: Recent Review Flowsheet Data     Depression screen [facility] 2/9 06/23/2021   Decreased Interest 0   Down, Depressed, Hopeless 0   PHQ - 2 Score 0      Interpretation of Total Score  Total Score Depression Severity:  1-4 = Minimal depression, 5-9 = Mild depression, 10-14 = Moderate depression, 15-19 = Moderately severe depression, 20-27 = Severe depression   Psychosocial Evaluation and Intervention:   Psychosocial Re-Evaluation:  Psychosocial Re-Evaluation     Row Name 07/20/21 0711             Psychosocial Re-Evaluation   Current issues with None Identified       Comments No changes with family support, very supportive family       Expected Outcomes No identifiable barriers. Anticipate very good outcomes with participation and attendance with remaining sessions.       Interventions Encouraged to attend Cardiac Rehabilitation for the exercise       Continue Psychosocial Services  No Follow up required                Psychosocial Discharge (Final Psychosocial Re-Evaluation):  Psychosocial Re-Evaluation - 07/20/21 0711       Psychosocial Re-Evaluation   Current issues with None Identified     Comments No changes with family support, very supportive family    Expected Outcomes No identifiable barriers. Anticipate very good outcomes with participation and attendance with remaining sessions.    Interventions Encouraged to attend Cardiac Rehabilitation for the exercise    Continue Psychosocial Services  No Follow up required             Vocational Rehabilitation: Provide vocational rehab assistance to qualifying candidates.   Vocational Rehab Evaluation & Intervention:  Vocational Rehab - 07/20/21 0815       Initial Vocational Rehab Evaluation & Intervention   Assessment shows need for Vocational Rehabilitation No             Education: Education Goals: Education classes will be provided on a weekly basis, covering required topics. Participant will state understanding/return demonstration of topics presented.  Learning Barriers/Preferences:  Learning Barriers/Preferences - 06/23/21 1038       Learning Barriers/Preferences   Learning Barriers Sight;Hearing   Hearing aides, Macular edema right eye, glasses.   Learning Preferences Audio;Computer/Internet;Group Instruction;Individual Instruction;Pictoral;Skilled  Demonstration;Verbal Instruction;Video;Written Material             Education Topics: Count Your Pulse:  -Group instruction provided by verbal instruction, demonstration, patient participation and written materials to support subject.  Instructors address importance of being able to find your pulse and how to count your pulse when at home without a heart monitor.  Patients get hands on experience counting their pulse with staff help and individually.   Heart Attack, Angina, and Risk Factor Modification:  -Group instruction provided by verbal instruction, video, and written materials to support subject.  Instructors address signs and symptoms of angina and heart attacks.    Also discuss risk factors for heart disease and how to make changes to improve  heart health risk factors.   Functional Fitness:  -Group instruction provided by verbal instruction, demonstration, patient participation, and written materials to support subject.  Instructors address safety measures for doing things around the house.  Discuss how to get up and down off the floor, how to pick things up properly, how to safely get out of a chair without assistance, and balance training.   Meditation and Mindfulness:  -Group instruction provided by verbal instruction, patient participation, and written materials to support subject.  Instructor addresses importance of mindfulness and meditation practice to help reduce stress and improve awareness.  Instructor also leads participants through a meditation exercise.    Stretching for Flexibility and Mobility:  -Group instruction provided by verbal instruction, patient participation, and written materials to support subject.  Instructors lead participants through series of stretches that are designed to increase flexibility thus improving mobility.  These stretches are additional exercise for major muscle groups that are typically performed during regular warm up and cool down.   Hands Only CPR:  -Group verbal, video, and participation provides a basic overview of AHA guidelines for community CPR. Role-play of emergencies allow participants the opportunity to practice calling for help and chest compression technique with discussion of AED use.   Hypertension: -Group verbal and written instruction that provides a basic overview of hypertension including the most recent diagnostic guidelines, risk factor reduction with self-care instructions and medication management.    Nutrition I class: Heart Healthy Eating:  -Group instruction provided by PowerPoint slides, verbal discussion, and written materials to support subject matter. The instructor gives an explanation and review of the Therapeutic Lifestyle Changes diet recommendations,  which includes a discussion on lipid goals, dietary fat, sodium, fiber, plant stanol/sterol esters, sugar, and the components of a well-balanced, healthy diet.   Nutrition II class: Lifestyle Skills:  -Group instruction provided by PowerPoint slides, verbal discussion, and written materials to support subject matter. The instructor gives an explanation and review of label reading, grocery shopping for heart health, heart healthy recipe modifications, and ways to make healthier choices when eating out.   Diabetes Question & Answer:  -Group instruction provided by PowerPoint slides, verbal discussion, and written materials to support subject matter. The instructor gives an explanation and review of diabetes co-morbidities, pre- and post-prandial blood glucose goals, pre-exercise blood glucose goals, signs, symptoms, and treatment of hypoglycemia and hyperglycemia, and foot care basics.   Diabetes Blitz:  -Group instruction provided by PowerPoint slides, verbal discussion, and written materials to support subject matter. The instructor gives an explanation and review of the physiology behind type 1 and type 2 diabetes, diabetes medications and rational behind using different medications, pre- and post-prandial blood glucose recommendations and Hemoglobin A1c goals, diabetes diet, and exercise including blood glucose guidelines  for exercising safely.    Portion Distortion:  -Group instruction provided by PowerPoint slides, verbal discussion, written materials, and food models to support subject matter. The instructor gives an explanation of serving size versus portion size, changes in portions sizes over the last 20 years, and what consists of a serving from each food group.   Stress Management:  -Group instruction provided by verbal instruction, video, and written materials to support subject matter.  Instructors review role of stress in heart disease and how to cope with stress positively.      Exercising on Your Own:  -Group instruction provided by verbal instruction, power point, and written materials to support subject.  Instructors discuss benefits of exercise, components of exercise, frequency and intensity of exercise, and end points for exercise.  Also discuss use of nitroglycerin and activating EMS.  Review options of places to exercise outside of rehab.  Review guidelines for sex with heart disease.   Cardiac Drugs I:  -Group instruction provided by verbal instruction and written materials to support subject.  Instructor reviews cardiac drug classes: antiplatelets, anticoagulants, beta blockers, and statins.  Instructor discusses reasons, side effects, and lifestyle considerations for each drug class.   Cardiac Drugs II:  -Group instruction provided by verbal instruction and written materials to support subject.  Instructor reviews cardiac drug classes: angiotensin converting enzyme inhibitors (ACE-I), angiotensin II receptor blockers (ARBs), nitrates, and calcium channel blockers.  Instructor discusses reasons, side effects, and lifestyle considerations for each drug class.   Anatomy and Physiology of the Circulatory System:  Group verbal and written instruction and models provide basic cardiac anatomy and physiology, with the coronary electrical and arterial systems. Review of: AMI, Angina, Valve disease, Heart Failure, Peripheral Artery Disease, Cardiac Arrhythmia, Pacemakers, and the ICD.   Other Education:  -Group or individual verbal, written, or video instructions that support the educational goals of the cardiac rehab program.   Holiday Eating Survival Tips:  -Group instruction provided by PowerPoint slides, verbal discussion, and written materials to support subject matter. The instructor gives patients tips, tricks, and techniques to help them not only survive but enjoy the holidays despite the onslaught of food that accompanies the holidays.   Knowledge  Questionnaire Score:  Knowledge Questionnaire Score - 06/23/21 1040       Knowledge Questionnaire Score   Pre Score 22/24             Core Components/Risk Factors/Patient Goals at Admission:  Personal Goals and Risk Factors at Admission - 06/23/21 1043       Core Components/Risk Factors/Patient Goals on Admission    Weight Management Yes;Weight Loss    Intervention Weight Management: Develop a combined nutrition and exercise program designed to reach desired caloric intake, while maintaining appropriate intake of nutrient and fiber, sodium and fats, and appropriate energy expenditure required for the weight goal.;Weight Management: Provide education and appropriate resources to help participant work on and attain dietary goals.;Weight Management/Obesity: Establish reasonable short term and long term weight goals.;Obesity: Provide education and appropriate resources to help participant work on and attain dietary goals.    Admit Weight 173 lb 4.5 oz (78.6 kg)    Expected Outcomes Short Term: Continue to assess and modify interventions until short term weight is achieved;Long Term: Adherence to nutrition and physical activity/exercise program aimed toward attainment of established weight goal;Understanding recommendations for meals to include 15-35% energy as protein, 25-35% energy from fat, 35-60% energy from carbohydrates, less than $RemoveB'200mg'[id number]$  of dietary cholesterol, 20-35 gm of total  fiber daily;Understanding of distribution of calorie intake throughout the day with the consumption of 4-5 meals/snacks    Hypertension Yes    Intervention Provide education on lifestyle modifcations including regular physical activity/exercise, weight management, moderate sodium restriction and increased consumption of fresh fruit, vegetables, and low fat dairy, alcohol moderation, and smoking cessation.;Monitor prescription use compliance.    Expected Outcomes Short Term: Continued assessment and intervention until  BP is < 140/43mm HG in hypertensive participants. < 130/78mm HG in hypertensive participants with diabetes, heart failure or chronic kidney disease.;Long Term: Maintenance of blood pressure at goal levels.    Lipids Yes    Intervention Provide education and support for participant on nutrition & aerobic/resistive exercise along with prescribed medications to achieve LDL '70mg'$ , HDL >$Remo'40mg'daBsq$ .    Expected Outcomes Short Term: Participant states understanding of desired cholesterol values and is compliant with medications prescribed. Participant is following exercise prescription and nutrition guidelines.;Long Term: Cholesterol controlled with medications as prescribed, with individualized exercise RX and with personalized nutrition plan. Value goals: LDL < $Rem'70mg'rbQm$ , HDL > 40 mg.    Personal Goal Other Yes    Personal Goal Short term: walk more and be healthier. Long term: gain strength, energy and lose wt    Intervention Will continue to monitor pt and progress workloads as tolerated without sign or symptom.    Expected Outcomes Pt will achieve his goals             Core Components/Risk Factors/Patient Goals Review:   Goals and Risk Factor Review     Row Name 07/20/21 0821             Core Components/Risk Factors/Patient Goals Review   Personal Goals Review Weight Management/Obesity;Lipids;Hypertension;Other  Increase strength and stamina       Review Patient continues to have a goal of losing some weight and understands healthy food choices, portion control and exercise is the pathway to achieve goals. He reports feeling stronger and his stamina is improving.       Expected Outcomes Patient will continue to participate in Cardiac rehab for exercise and positive lifestyle modification.                Core Components/Risk Factors/Patient Goals at Discharge (Final Review):   Goals and Risk Factor Review - 07/20/21 0821       Core Components/Risk Factors/Patient Goals Review   Personal  Goals Review Weight Management/Obesity;Lipids;Hypertension;Other   Increase strength and stamina   Review Patient continues to have a goal of losing some weight and understands healthy food choices, portion control and exercise is the pathway to achieve goals. He reports feeling stronger and his stamina is improving.    Expected Outcomes Patient will continue to participate in Cardiac rehab for exercise and positive lifestyle modification.             ITP Comments:  ITP Comments     Row Name 06/23/21 1030 06/27/21 1543 07/20/21 0831       ITP Comments Dr. Fransico Him MD, medical director Dr. Fransico Him, Medical Director.  30-day ITP review period. Tip had his first session of exercise on 06-27-2021 and tolerated exercise very well. He has a very positive outlook. Cardiac rehab staff will continue to work with Jenny Reichmann to assist in meeting his health, fitness and wellness goals Dr. Fransico Him, Medical Director;  30 day ITP review/evaluation              Comments: Dang feels good about the progress he  is making, especially with improvements in strength and stamina. He is working on Best boy and healthy meal choices to aid in his goal to lose weight and overall health. Cardiac rehab staff will continue to encourage and support Esley with his cardiac rehab goals. VSS continue to be stable during his exercise sessions.

## 2021-07-22 ENCOUNTER — Encounter (HOSPITAL_COMMUNITY)
Admission: RE | Admit: 2021-07-22 | Discharge: 2021-07-22 | Disposition: A | Discharge status: Home / Self Care | Payer: Medicare Other | Source: Ambulatory Visit | Attending: Interventional Cardiology | Admitting: Interventional Cardiology

## 2021-07-22 ENCOUNTER — Other Ambulatory Visit: Payer: Self-pay

## 2021-07-22 DIAGNOSIS — G4733 Obstructive sleep apnea (adult) (pediatric): Secondary | ICD-10-CM | POA: Diagnosis not present

## 2021-07-22 DIAGNOSIS — Z955 Presence of coronary angioplasty implant and graft: Secondary | ICD-10-CM

## 2021-07-25 ENCOUNTER — Other Ambulatory Visit: Payer: Self-pay

## 2021-07-25 ENCOUNTER — Encounter (HOSPITAL_COMMUNITY)
Admission: RE | Admit: 2021-07-25 | Discharge: 2021-07-25 | Disposition: A | Discharge status: Home / Self Care | Payer: Medicare Other | Source: Ambulatory Visit | Attending: Interventional Cardiology | Admitting: Interventional Cardiology

## 2021-07-25 DIAGNOSIS — G4733 Obstructive sleep apnea (adult) (pediatric): Secondary | ICD-10-CM | POA: Diagnosis not present

## 2021-07-25 DIAGNOSIS — Z955 Presence of coronary angioplasty implant and graft: Secondary | ICD-10-CM

## 2021-07-27 ENCOUNTER — Encounter (HOSPITAL_COMMUNITY)
Admission: RE | Admit: 2021-07-27 | Discharge: 2021-07-27 | Disposition: A | Discharge status: Home / Self Care | Payer: Medicare Other | Source: Ambulatory Visit | Attending: Interventional Cardiology | Admitting: Interventional Cardiology

## 2021-07-27 ENCOUNTER — Other Ambulatory Visit: Payer: Self-pay

## 2021-07-27 DIAGNOSIS — Z955 Presence of coronary angioplasty implant and graft: Secondary | ICD-10-CM | POA: Diagnosis not present

## 2021-07-27 DIAGNOSIS — G4733 Obstructive sleep apnea (adult) (pediatric): Secondary | ICD-10-CM | POA: Diagnosis not present

## 2021-07-27 DIAGNOSIS — E785 Hyperlipidemia, unspecified: Secondary | ICD-10-CM

## 2021-07-29 ENCOUNTER — Encounter (HOSPITAL_COMMUNITY)
Admission: RE | Admit: 2021-07-29 | Discharge: 2021-07-29 | Disposition: A | Discharge status: Home / Self Care | Payer: Medicare Other | Source: Ambulatory Visit | Attending: Interventional Cardiology | Admitting: Interventional Cardiology

## 2021-07-29 ENCOUNTER — Encounter (HOSPITAL_COMMUNITY): Payer: Medicare Other

## 2021-07-29 ENCOUNTER — Other Ambulatory Visit: Payer: Self-pay

## 2021-07-29 DIAGNOSIS — Z955 Presence of coronary angioplasty implant and graft: Secondary | ICD-10-CM | POA: Diagnosis not present

## 2021-07-29 DIAGNOSIS — G4733 Obstructive sleep apnea (adult) (pediatric): Secondary | ICD-10-CM | POA: Diagnosis not present

## 2021-08-01 ENCOUNTER — Other Ambulatory Visit: Payer: Self-pay

## 2021-08-01 ENCOUNTER — Encounter (HOSPITAL_COMMUNITY): Payer: Medicare Other

## 2021-08-01 ENCOUNTER — Encounter (HOSPITAL_COMMUNITY)
Admission: RE | Admit: 2021-08-01 | Discharge: 2021-08-01 | Disposition: A | Discharge status: Home / Self Care | Payer: Medicare Other | Source: Ambulatory Visit | Attending: Interventional Cardiology | Admitting: Interventional Cardiology

## 2021-08-01 DIAGNOSIS — Z955 Presence of coronary angioplasty implant and graft: Secondary | ICD-10-CM | POA: Diagnosis not present

## 2021-08-01 DIAGNOSIS — G4733 Obstructive sleep apnea (adult) (pediatric): Secondary | ICD-10-CM | POA: Diagnosis not present

## 2021-08-03 ENCOUNTER — Other Ambulatory Visit: Payer: Self-pay

## 2021-08-03 ENCOUNTER — Encounter (HOSPITAL_COMMUNITY): Payer: Medicare Other

## 2021-08-03 ENCOUNTER — Encounter (HOSPITAL_COMMUNITY)
Admission: RE | Admit: 2021-08-03 | Discharge: 2021-08-03 | Disposition: A | Discharge status: Home / Self Care | Payer: Medicare Other | Source: Ambulatory Visit | Attending: Interventional Cardiology | Admitting: Interventional Cardiology

## 2021-08-03 DIAGNOSIS — Z955 Presence of coronary angioplasty implant and graft: Secondary | ICD-10-CM

## 2021-08-03 DIAGNOSIS — G4733 Obstructive sleep apnea (adult) (pediatric): Secondary | ICD-10-CM | POA: Diagnosis not present

## 2021-08-05 ENCOUNTER — Other Ambulatory Visit: Payer: Self-pay

## 2021-08-05 ENCOUNTER — Encounter (HOSPITAL_COMMUNITY)
Admission: RE | Admit: 2021-08-05 | Discharge: 2021-08-05 | Disposition: A | Discharge status: Home / Self Care | Payer: Medicare Other | Source: Ambulatory Visit | Attending: Interventional Cardiology | Admitting: Interventional Cardiology

## 2021-08-05 ENCOUNTER — Encounter (HOSPITAL_COMMUNITY): Payer: Medicare Other

## 2021-08-05 DIAGNOSIS — G4733 Obstructive sleep apnea (adult) (pediatric): Secondary | ICD-10-CM | POA: Diagnosis not present

## 2021-08-05 DIAGNOSIS — Z955 Presence of coronary angioplasty implant and graft: Secondary | ICD-10-CM

## 2021-08-08 ENCOUNTER — Encounter (HOSPITAL_COMMUNITY): Payer: Medicare Other

## 2021-08-08 ENCOUNTER — Encounter (HOSPITAL_COMMUNITY)
Admission: RE | Admit: 2021-08-08 | Discharge: 2021-08-08 | Disposition: A | Discharge status: Home / Self Care | Payer: Medicare Other | Source: Ambulatory Visit | Attending: Interventional Cardiology | Admitting: Interventional Cardiology

## 2021-08-08 ENCOUNTER — Other Ambulatory Visit: Payer: Self-pay

## 2021-08-08 DIAGNOSIS — Z955 Presence of coronary angioplasty implant and graft: Secondary | ICD-10-CM | POA: Diagnosis not present

## 2021-08-08 DIAGNOSIS — G4733 Obstructive sleep apnea (adult) (pediatric): Secondary | ICD-10-CM | POA: Diagnosis not present

## 2021-08-10 ENCOUNTER — Encounter (HOSPITAL_COMMUNITY): Payer: Medicare Other

## 2021-08-10 ENCOUNTER — Other Ambulatory Visit: Payer: Self-pay

## 2021-08-10 ENCOUNTER — Encounter (HOSPITAL_COMMUNITY)
Admission: RE | Admit: 2021-08-10 | Discharge: 2021-08-10 | Disposition: A | Discharge status: Home / Self Care | Payer: Medicare Other | Source: Ambulatory Visit | Attending: Interventional Cardiology | Admitting: Interventional Cardiology

## 2021-08-10 DIAGNOSIS — G4733 Obstructive sleep apnea (adult) (pediatric): Secondary | ICD-10-CM | POA: Diagnosis not present

## 2021-08-10 DIAGNOSIS — Z955 Presence of coronary angioplasty implant and graft: Secondary | ICD-10-CM

## 2021-08-12 ENCOUNTER — Encounter (HOSPITAL_COMMUNITY): Payer: Medicare Other

## 2021-08-15 ENCOUNTER — Encounter (HOSPITAL_COMMUNITY): Payer: Medicare Other

## 2021-08-15 ENCOUNTER — Other Ambulatory Visit: Payer: Self-pay

## 2021-08-15 ENCOUNTER — Encounter (HOSPITAL_COMMUNITY)
Admission: RE | Admit: 2021-08-15 | Discharge: 2021-08-15 | Disposition: A | Discharge status: Home / Self Care | Payer: Medicare Other | Source: Ambulatory Visit | Attending: Interventional Cardiology | Admitting: Interventional Cardiology

## 2021-08-15 VITALS — Ht 66.0 in | Wt 173.5 lb

## 2021-08-15 DIAGNOSIS — Z955 Presence of coronary angioplasty implant and graft: Secondary | ICD-10-CM | POA: Diagnosis not present

## 2021-08-15 DIAGNOSIS — G4733 Obstructive sleep apnea (adult) (pediatric): Secondary | ICD-10-CM | POA: Diagnosis not present

## 2021-08-16 NOTE — Progress Notes (Signed)
Cardiac Individual Treatment Plan  Patient Details  Name: Randy Hudson MRN: [id number] Date of Birth: 03/12/1946 Referring Provider:   Flowsheet Row CARDIAC REHAB PHASE II ORIENTATION from 06/23/2021 in [facility]  Referring Provider dr. 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 Swallow whole., Disp: 90 tablet, Rfl: 3   atorvastatin (LIPITOR) 80 MG tablet, Take 80 mg by mouth in the morning., Disp: , Rfl:    carboxymethylcellul-glycerin (LUBRICANT DROPS/DUAL-ACTION) 0.5-0.9 % ophthalmic solution, Place 1 drop into both eyes 3 (three) times daily as needed (dry/irritated eyes.)., Disp: , Rfl:    Cholecalciferol (VITAMIN D) 50 MCG (2000 UT) tablet, Take 2,000 Units by mouth in the morning., Disp: , Rfl:    clopidogrel (PLAVIX) 75 MG tablet, Take 1 tablet (75 mg total) by mouth daily with breakfast., Disp: 90 tablet, Rfl: 3   ezetimibe (ZETIA) 10 MG tablet, Take 1 tablet (10 mg total) by mouth daily. (Patient not taking: Reported on 06/15/2021), Disp: 90 tablet, Rfl: 3   inclisiran (LEQVIO) 284 MG/1.5ML SOSY injection, Inject 284 mg into the skin as directed. Administered subcutaneously every 6 months, Disp: , Rfl:    metoprolol succinate (TOPROL XL) 25 MG 24 hr tablet, Take 1 tablet (25 mg total) by mouth daily. Take with or immediately following a meal., Disp: 90 tablet, Rfl: 1   Multiple Vitamins-Minerals (PRESERVISION AREDS 2+MULTI VIT PO), Take 1 capsule by mouth 2 (two) times  daily., Disp: , Rfl:    nitroGLYCERIN (NITROSTAT) 0.4 MG SL tablet, Place 1 tablet (0.4 mg total) under the tongue every 5 (five) minutes as needed for chest pain., Disp: 25 tablet, Rfl: 6   tobramycin (TOBREX) 0.3 % ophthalmic solution, Place 1 drop into the right eye as directed. Instill 1 drop into right eye 4 times daily the day before, the day of & the day following eylea intravitreal injection, Disp: , Rfl:    triamterene-hydrochlorothiazide (DYAZIDE) 37.5-25 MG capsule, Take 1 capsule by mouth every morning., Disp: , Rfl:   Past Medical History: Past Medical History:  Diagnosis Date   Hyperlipidemia    Hypertension     Tobacco Use: Social History   Tobacco Use  Smoking Status Never  Smokeless Tobacco Never    Labs: Recent Review Flowsheet Data   There is no flowsheet data to display.     Capillary Blood Glucose: No results found for: GLUCAP   Exercise Target Goals: Exercise Program Goal: Individual exercise prescription set using results from initial 6 min walk test and THRR while considering  patient's activity barriers and safety.   Exercise Prescription Goal: Starting with aerobic activity 30 plus minutes a day, 3 days per week for initial exercise prescription. Provide home exercise prescription and guidelines that participant acknowledges understanding prior to discharge.  Activity Barriers & Risk Stratification:  Activity Barriers & Cardiac Risk Stratification - 06/23/21 1029       Activity Barriers & Cardiac Risk Stratification   Activity Barriers  Deconditioning    Cardiac Risk Stratification High             6 Minute Walk:  6 Minute Walk     Row Name 06/23/21 0951 08/15/21 0830       6 Minute Walk   Phase Initial Discharge    Distance 1200 feet 1298 feet    Distance % Change -- 8.17 %    Distance Feet Change -- 98 ft    Walk Time 6 minutes 6 minutes    # of Rest Breaks 0 0    MPH 2.27 2.46    METS 2.36 2.76    RPE 9 11    Perceived  Dyspnea  0 0    VO2 Peak 8.26 9.66    Symptoms No No    Resting HR 71 bpm 67 bpm    Resting BP 120/62 120/64    Resting Oxygen Saturation  97 % 97 %    Exercise Oxygen Saturation  during 6 min walk 98 % 98 %    Max Ex. HR 90 bpm 110 bpm    Max Ex. BP 122/72 128/62    2 Minute Post BP 120/70 --             Oxygen Initial Assessment:   Oxygen Re-Evaluation:   Oxygen Discharge (Final Oxygen Re-Evaluation):   Initial Exercise Prescription:  Initial Exercise Prescription - 06/23/21 0900       Date of Initial Exercise RX and Referring Provider   Date 06/23/21    Referring Provider dr. [name], MD    Expected Discharge Date 08/19/21      Recumbant Bike   Level 1.5    Minutes 15    METs 1.9      NuStep   Level 2    SPM 75    Minutes 15    METs 1.9      Prescription Details   Frequency (times per week) 3    Duration Progress to 30 minutes of continuous aerobic without signs/symptoms of physical distress      Intensity   THRR 40-80% of Max Heartrate 59-118    Ratings of Perceived Exertion 11-13    Perceived Dyspnea 0-4      Progression   Progression Continue progressive overload as per policy without signs/symptoms or physical distress.      Resistance Training   Training Prescription Yes    Weight 3    Reps 10-15             Perform Capillary Blood Glucose checks as needed.  Exercise Prescription Changes:   Exercise Prescription Changes     Row Name 06/27/21 0821 07/13/21 0814 07/27/21 0830 08/08/21 0830       Response to Exercise   Blood Pressure (Admit) 114/68 110/62 122/60 120/72    Blood Pressure (Exercise) 138/68 130/64 130/62 155/62    Blood Pressure (Exit) 120/70 108/64 104/62 120/76    Heart Rate (Admit) 76 bpm 92 bpm 72 bpm 62 bpm    Heart Rate (Exercise) 93 bpm 98 bpm 100 bpm 105 bpm    Heart Rate (Exit) 85 bpm 87 bpm 81 bpm 72 bpm    Rating of Perceived Exertion (Exercise) 9 11.5 13 13     Perceived Dyspnea (Exercise) 0  0 0 0    Symptoms 0 0 0 0    Comments Pt first day of exercise Reviewed MET, goals and home ExRx Reviewed METs Reviewed METs and goals    Duration  Progress to 30 minutes of  aerobic without signs/symptoms of physical distress Progress to 30 minutes of  aerobic without signs/symptoms of physical distress Progress to 30 minutes of  aerobic without signs/symptoms of physical distress Progress to 30 minutes of  aerobic without signs/symptoms of physical distress    Intensity THRR unchanged THRR unchanged THRR unchanged THRR unchanged      Progression   Progression Continue to progress workloads to maintain intensity without signs/symptoms of physical distress. Continue to progress workloads to maintain intensity without signs/symptoms of physical distress. Continue to progress workloads to maintain intensity without signs/symptoms of physical distress. Continue to progress workloads to maintain intensity without signs/symptoms of physical distress.    Average METs 2 2.15 2.9 2.8      Resistance Training   Training Prescription Yes No No Yes    Weight 3 -- -- 4    Reps 10-15 -- -- 10-15    Time 10 Minutes -- -- 10 Minutes      Recumbant Bike   Level 1.5 2 3.5 3.5    Minutes 15 15 15 15     METs 1.9 2.1 2.9 2.7      NuStep   Level 2 2 2 2     SPM 75 75 95 95    Minutes 15 15 15 15     METs 2.1 2.2 2.9 2.9      Home Exercise Plan   Plans to continue exercise at -- Home (comment) Home (comment) Home (comment)    Frequency -- Add 2 additional days to program exercise sessions. Add 2 additional days to program exercise sessions. Add 2 additional days to program exercise sessions.    Initial Home Exercises Provided -- 07/13/21 07/13/21 07/13/21             Exercise Comments:   Exercise Comments     Row Name 06/27/21 0827 07/13/21 0823 07/27/21 0830 08/08/21 0830     Exercise Comments Pt first day of exercise in the CRP2 program. Pt tolerated exercise well with an average MET level of  2.0. Pt is off to a good start. Will continue to monitor pt and progress workloads as tolerated without sign or symptom Reviewed MET's, goals and home Ex Rx. Pt tolerated exercise well with an average MET level of 2.15. Pt feels like he is getting stronger and has more energy since starting CRP2. He states that he is also able to walk longer distances and was able to be a chaperone for a field trip with his family yesterday. He will start walking 2 days a week for 30-45 minutes a session to help increase his cardiorespiratory fitness. Reviewed MET's with pt today. Pt in tolerating exercise well with an average MET level of 2.9. Will continue to monitor pt and progress workloads as tolerated Reviewed MET's and goals with pt today. Pt in tolerating exercise well with an average MET level of 2.8. Pt feels like he is gaining more energy and strength. Pt also feels like his ADL's have become easier and he can walk longer distances when weather allows             Exercise Goals and Review:   Exercise Goals     Row Name 06/23/21 1030             Exercise Goals   Increase Physical Activity Yes       Intervention Provide advice, education, support and counseling about physical activity/exercise needs.;Develop an individualized exercise prescription for aerobic and resistive  training based on initial evaluation findings, risk stratification, comorbidities and participant's personal goals.       Expected Outcomes Short Term: Attend rehab on a regular basis to increase amount of physical activity.;Long Term: Add in home exercise to make exercise part of routine and to increase amount of physical activity.;Long Term: Exercising regularly at least 3-5 days a week.       Increase Strength and Stamina Yes       Intervention Provide advice, education, support and counseling about physical activity/exercise needs.;Develop an individualized exercise prescription for aerobic and resistive training based on  initial evaluation findings, risk stratification, comorbidities and participant's personal goals.       Expected Outcomes Short Term: Increase workloads from initial exercise prescription for resistance, speed, and METs.;Short Term: Perform resistance training exercises routinely during rehab and add in resistance training at home;Long Term: Improve cardiorespiratory fitness, muscular endurance and strength as measured by increased METs and functional capacity (6MWT)       Able to understand and use rate of perceived exertion (RPE) scale Yes       Intervention Provide education and explanation on how to use RPE scale       Expected Outcomes Short Term: Able to use RPE daily in rehab to express subjective intensity level;Long Term:  Able to use RPE to guide intensity level when exercising independently       Knowledge and understanding of Target Heart Rate Range (THRR) Yes       Intervention Provide education and explanation of THRR including how the numbers were predicted and where they are located for reference       Expected Outcomes Short Term: Able to state/look up THRR;Long Term: Able to use THRR to govern intensity when exercising independently;Short Term: Able to use daily as guideline for intensity in rehab       Understanding of Exercise Prescription Yes       Intervention Provide education, explanation, and written materials on patient's individual exercise prescription       Expected Outcomes Short Term: Able to explain program exercise prescription;Long Term: Able to explain home exercise prescription to exercise independently                Exercise Goals Re-Evaluation :  Exercise Goals Re-Evaluation     Row Name 06/27/21 0825 07/13/21 0818 07/20/21 0818 07/27/21 0830 08/08/21 0830     Exercise Goal Re-Evaluation   Exercise Goals Review Increase Physical Activity;Increase Strength and Stamina;Able to understand and use rate of perceived exertion (RPE) scale;Knowledge and  understanding of Target Heart Rate Range (THRR);Understanding of Exercise Prescription Increase Physical Activity;Increase Strength and Stamina;Able to understand and use rate of perceived exertion (RPE) scale;Knowledge and understanding of Target Heart Rate Range (THRR);Understanding of Exercise Prescription Increase Physical Activity;Increase Strength and Stamina;Able to understand and use rate of perceived exertion (RPE) scale;Knowledge and understanding of Target Heart Rate Range (THRR);Understanding of Exercise Prescription Increase Physical Activity;Increase Strength and Stamina;Able to understand and use rate of perceived exertion (RPE) scale;Knowledge and understanding of Target Heart Rate Range (THRR);Understanding of Exercise Prescription Increase Physical Activity;Increase Strength and Stamina;Able to understand and use rate of perceived exertion (RPE) scale;Knowledge and understanding of Target Heart Rate Range (THRR);Understanding of Exercise Prescription   Comments Pt first day of exercise in the CRP2 program. Pt tolerated exercise well with an average MET level of 2.0. Pt is off to a good start Reviewed MET's, goals and home Ex Rx. Pt tolerated exercise well with an average  MET level of 2.15. Pt feels like he is getting stronger and has more energy since starting CRP2. He states that he is also able to walk longer distances and was able to be a chaperone for a field trip with his family yesterday. He will start walking 2 days a week for 30-45 minutes a session to help increase his cardiorespiratory fitness. Patient making good progress with exercise. Patient would like to achieve 3.0 METs. Increased workload on recumbent bike from 2.0 to 2.5, and patient tolerated well. Patient would like to increase to from 2.5 to 3.0 on Friday. Reviewed MET's with pt today. Pt in tolerating exercise well with an average MET level of 2.9. Reviewed MET's and goals with pt today. Pt in tolerating exercise well with an  average MET level of 2.8. Pt feels like he is gaining more energy and strength. Pt also feels like his ADL's have become easier and he can walk longer distances when weather allows   Expected Outcomes Will continue to monitor pt and progress workloads as tolerated without sign or symptom Pt will add in 2 days walking for 30-45 minutes per session. Will continue to monitor pt and progress workloads as tolerated without sign or symptom Continue to progress workloads as tolerated to help increase MET level with exercise. Continue to progress workloads as tolerated Will continue to monitor pt and progress workloads as tolerated without sign or symptom             Discharge Exercise Prescription (Final Exercise Prescription Changes):  Exercise Prescription Changes - 08/08/21 0830       Response to Exercise   Blood Pressure (Admit) 120/72    Blood Pressure (Exercise) 155/62    Blood Pressure (Exit) 120/76    Heart Rate (Admit) 62 bpm    Heart Rate (Exercise) 105 bpm    Heart Rate (Exit) 72 bpm    Rating of Perceived Exertion (Exercise) 13    Perceived Dyspnea (Exercise) 0    Symptoms 0    Comments Reviewed METs and goals    Duration Progress to 30 minutes of  aerobic without signs/symptoms of physical distress    Intensity THRR unchanged      Progression   Progression Continue to progress workloads to maintain intensity without signs/symptoms of physical distress.    Average METs 2.8      Resistance Training   Training Prescription Yes    Weight 4    Reps 10-15    Time 10 Minutes      Recumbant Bike   Level 3.5    Minutes 15    METs 2.7      NuStep   Level 2    SPM 95    Minutes 15    METs 2.9      Home Exercise Plan   Plans to continue exercise at Home (comment)    Frequency Add 2 additional days to program exercise sessions.    Initial Home Exercises Provided 07/13/21             Nutrition:  Target Goals: Understanding of nutrition guidelines, daily intake of  sodium <1534m, cholesterol <20104m calories 30% from fat and 7% or less from saturated fats, daily to have 5 or more servings of fruits and vegetables.  Biometrics:  Pre Biometrics - 06/23/21 1028       Pre Biometrics   Waist Circumference 39 inches    Hip Circumference 38.5 inches    Waist to Hip Ratio 1.01 %  Triceps Skinfold 13 mm    % Body Fat 27 %    Grip Strength 42 kg    Flexibility --   pt unable to reach   Single Leg Stand 4.4 seconds             Post Biometrics - 08/15/21 0830        Post  Biometrics   Height 5' 6"  (1.676 m)    Weight 78.7 kg    Waist Circumference 39.5 inches    Hip Circumference 39 inches    Waist to Hip Ratio 1.01 %    BMI (Calculated) 28.02    Triceps Skinfold 11 mm    % Body Fat 26.6 %    Grip Strength 43 kg    Flexibility 5 in    Single Leg Stand 4.6 seconds             Nutrition Therapy Plan and Nutrition Goals:   Nutrition Assessments:  MEDIFICTS Score Key: ?70 Need to make dietary changes  40-70 Heart Healthy Diet ? 40 Therapeutic Level Cholesterol Diet   Picture Your Plate Scores: <99 Unhealthy dietary pattern with much room for improvement. 41-50 Dietary pattern unlikely to meet recommendations for good health and room for improvement. 51-60 More healthful dietary pattern, with some room for improvement.  >60 Healthy dietary pattern, although there may be some specific behaviors that could be improved.    Nutrition Goals Re-Evaluation:   Nutrition Goals Discharge (Final Nutrition Goals Re-Evaluation):   Psychosocial: Target Goals: Acknowledge presence or absence of significant depression and/or stress, maximize coping skills, provide positive support system. Participant is able to verbalize types and ability to use techniques and skills needed for reducing stress and depression.  Initial Review & Psychosocial Screening:  Initial Psych Review & Screening - 06/23/21 1015       Initial Review   Current  issues with None Identified      Family Dynamics   Good Support System? Yes    Comments Patient is married and has good support. Demonstrates positive coping skills and willingness to work on healthy lifestyle changes to improve health. He has not been a formal exerciser. An activity he enjoys is cleaning and waxing his muscle cars in his garage.      Barriers   Psychosocial barriers to participate in program There are no identifiable barriers or psychosocial needs.      Screening Interventions   Interventions Encouraged to exercise    Expected Outcomes Short Term goal: Identification and review with participant of any Quality of Life or Depression concerns found by scoring the questionnaire.   No QOL concerns identified. No depression concerns            Quality of Life Scores:  Quality of Life - 06/23/21 1037       Quality of Life   Select Quality of Life      Quality of Life Scores   Health/Function Pre 28 %    Socioeconomic Pre 29.29 %    Psych/Spiritual Pre 30 %    Family Pre 27.6 %    GLOBAL Pre 28.62 %            Scores of 19 and below usually indicate a poorer quality of life in these areas.  A difference of  2-3 points is a clinically meaningful difference.  A difference of 2-3 points in the total score of the Quality of Life Index has been associated with significant improvement in overall quality of  life, self-image, physical symptoms, and general health in studies assessing change in quality of life.  PHQ-9: Recent Review Flowsheet Data     Depression screen [facility] 2/9 06/23/2021   Decreased Interest 0   Down, Depressed, Hopeless 0   PHQ - 2 Score 0      Interpretation of Total Score  Total Score Depression Severity:  1-4 = Minimal depression, 5-9 = Mild depression, 10-14 = Moderate depression, 15-19 = Moderately severe depression, 20-27 = Severe depression   Psychosocial Evaluation and Intervention:   Psychosocial Re-Evaluation:  Psychosocial  Re-Evaluation     Row Name 07/20/21 0711 08/16/21 1604           Psychosocial Re-Evaluation   Current issues with None Identified None Identified      Comments No changes with family support, very supportive family No changes with family support, very supportive family      Expected Outcomes No identifiable barriers. Anticipate very good outcomes with participation and attendance with remaining sessions. No identifiable barriers. Anticipate very good outcomes with participation and attendance with remaining sessions.      Interventions Encouraged to attend Cardiac Rehabilitation for the exercise Encouraged to attend Cardiac Rehabilitation for the exercise      Continue Psychosocial Services  No Follow up required No Follow up required               Psychosocial Discharge (Final Psychosocial Re-Evaluation):  Psychosocial Re-Evaluation - 08/16/21 1604       Psychosocial Re-Evaluation   Current issues with None Identified    Comments No changes with family support, very supportive family    Expected Outcomes No identifiable barriers. Anticipate very good outcomes with participation and attendance with remaining sessions.    Interventions Encouraged to attend Cardiac Rehabilitation for the exercise    Continue Psychosocial Services  No Follow up required             Vocational Rehabilitation: Provide vocational rehab assistance to qualifying candidates.   Vocational Rehab Evaluation & Intervention:  Vocational Rehab - 07/20/21 0815       Initial Vocational Rehab Evaluation & Intervention   Assessment shows need for Vocational Rehabilitation No             Education: Education Goals: Education classes will be provided on a weekly basis, covering required topics. Participant will state understanding/return demonstration of topics presented.  Learning Barriers/Preferences:  Learning Barriers/Preferences - 06/23/21 1038       Learning Barriers/Preferences    Learning Barriers Sight;Hearing   Hearing aides, Macular edema right eye, glasses.   Learning Preferences Audio;Computer/Internet;Group Instruction;Individual Instruction;Pictoral;Skilled Demonstration;Verbal Instruction;Video;Written Material             Education Topics: Hypertension, Hypertension Reduction -Define heart disease and high blood pressure. Discus how high blood pressure affects the body and ways to reduce high blood pressure.   Exercise and Your Heart -Discuss why it is important to exercise, the FITT principles of exercise, normal and abnormal responses to exercise, and how to exercise safely.   Angina -Discuss definition of angina, causes of angina, treatment of angina, and how to decrease risk of having angina.   Cardiac Medications -Review what the following cardiac medications are used for, how they affect the body, and side effects that may occur when taking the medications.  Medications include Aspirin, Beta blockers, calcium channel blockers, ACE Inhibitors, angiotensin receptor blockers, diuretics, digoxin, and antihyperlipidemics.   Congestive Heart Failure -Discuss the definition of CHF, how to live with  CHF, the signs and symptoms of CHF, and how keep track of weight and sodium intake.   Heart Disease and Intimacy -Discus the effect sexual activity has on the heart, how changes occur during intimacy as we age, and safety during sexual activity.   Smoking Cessation / COPD -Discuss different methods to quit smoking, the health benefits of quitting smoking, and the definition of COPD.   Nutrition I: Fats -Discuss the types of cholesterol, what cholesterol does to the heart, and how cholesterol levels can be controlled.   Nutrition II: Labels -Discuss the different components of food labels and how to read food label   Heart Parts/Heart Disease and PAD -Discuss the anatomy of the heart, the pathway of blood circulation through the heart, and  these are affected by heart disease.   Stress I: Signs and Symptoms -Discuss the causes of stress, how stress may lead to anxiety and depression, and ways to limit stress.   Stress II: Relaxation -Discuss different types of relaxation techniques to limit stress.   Warning Signs of Stroke / TIA -Discuss definition of a stroke, what the signs and symptoms are of a stroke, and how to identify when someone is having stroke.   Knowledge Questionnaire Score:  Knowledge Questionnaire Score - 06/23/21 1040       Knowledge Questionnaire Score   Pre Score 22/24             Core Components/Risk Factors/Patient Goals at Admission:  Personal Goals and Risk Factors at Admission - 06/23/21 1043       Core Components/Risk Factors/Patient Goals on Admission    Weight Management Yes;Weight Loss    Intervention Weight Management: Develop a combined nutrition and exercise program designed to reach desired caloric intake, while maintaining appropriate intake of nutrient and fiber, sodium and fats, and appropriate energy expenditure required for the weight goal.;Weight Management: Provide education and appropriate resources to help participant work on and attain dietary goals.;Weight Management/Obesity: Establish reasonable short term and long term weight goals.;Obesity: Provide education and appropriate resources to help participant work on and attain dietary goals.    Admit Weight 173 lb 4.5 oz (78.6 kg)    Expected Outcomes Short Term: Continue to assess and modify interventions until short term weight is achieved;Long Term: Adherence to nutrition and physical activity/exercise program aimed toward attainment of established weight goal;Understanding recommendations for meals to include 15-35% energy as protein, 25-35% energy from fat, 35-60% energy from carbohydrates, less than 278m of dietary cholesterol, 20-35 gm of total fiber daily;Understanding of distribution of calorie intake throughout the  day with the consumption of 4-5 meals/snacks    Hypertension Yes    Intervention Provide education on lifestyle modifcations including regular physical activity/exercise, weight management, moderate sodium restriction and increased consumption of fresh fruit, vegetables, and low fat dairy, alcohol moderation, and smoking cessation.;Monitor prescription use compliance.    Expected Outcomes Short Term: Continued assessment and intervention until BP is < 140/972mHG in hypertensive participants. < 130/8067mG in hypertensive participants with diabetes, heart failure or chronic kidney disease.;Long Term: Maintenance of blood pressure at goal levels.    Lipids Yes    Intervention Provide education and support for participant on nutrition & aerobic/resistive exercise along with prescribed medications to achieve LDL <19m37mDL >40mg50m Expected Outcomes Short Term: Participant states understanding of desired cholesterol values and is compliant with medications prescribed. Participant is following exercise prescription and nutrition guidelines.;Long Term: Cholesterol controlled with medications as prescribed, with individualized exercise  RX and with personalized nutrition plan. Value goals: LDL < 50m, HDL > 40 mg.    Personal Goal Other Yes    Personal Goal Short term: walk more and be healthier. Long term: gain strength, energy and lose wt    Intervention Will continue to monitor pt and progress workloads as tolerated without sign or symptom.    Expected Outcomes Pt will achieve his goals             Core Components/Risk Factors/Patient Goals Review:   Goals and Risk Factor Review     Row Name 07/20/21 0016511/29/22 1604           Core Components/Risk Factors/Patient Goals Review   Personal Goals Review Weight Management/Obesity;Lipids;Hypertension;Other  Increase strength and stamina Weight Management/Obesity;Lipids;Hypertension;Other  Increase strength and stamina      Review Patient  continues to have a goal of losing some weight and understands healthy food choices, portion control and exercise is the pathway to achieve goals. He reports feeling stronger and his stamina is improving. Patient continues to have a goal of losing some weight and understands healthy food choices, portion control and exercise is the pathway to achieve goals. He reports feeling stronger and his stamina is improving. JCharbelwill complete cardiac rehab on 08/19/21.      Expected Outcomes Patient will continue to participate in Cardiac rehab for exercise and positive lifestyle modification. Patient will continue to  exercise and follow  positive lifestyle modifications upon completion of phase 2 cardiac rehab.               Core Components/Risk Factors/Patient Goals at Discharge (Final Review):   Goals and Risk Factor Review - 08/16/21 1604       Core Components/Risk Factors/Patient Goals Review   Personal Goals Review Weight Management/Obesity;Lipids;Hypertension;Other   Increase strength and stamina   Review Patient continues to have a goal of losing some weight and understands healthy food choices, portion control and exercise is the pathway to achieve goals. He reports feeling stronger and his stamina is improving. JMarisolwill complete cardiac rehab on 08/19/21.    Expected Outcomes Patient will continue to  exercise and follow  positive lifestyle modifications upon completion of phase 2 cardiac rehab.             ITP Comments:  ITP Comments     Row Name 06/23/21 1030 06/27/21 1543 07/20/21 0831 08/16/21 1602     ITP Comments Dr. TFransico HimMD, medical director Dr. TFransico Him Medical Director.  30-day ITP review period. Ermon had his first session of exercise on 06-27-2021 and tolerated exercise very well. He has a very positive outlook. Cardiac rehab staff will continue to work with JJenny Reichmannto assist in meeting his health, fitness and wellness goals Dr. TFransico Him Medical Director;  30 day  ITP review/evaluation Dr. TFransico Him Medical Director;  30 day ITP review/evaluation.  JDarroldhas good attendance and participation in phase 2 cardiac rehab. Jaremy will complete phase 2 cardiac rehab on 08/19/21             Comments: See ITP comments. JNekodawill complete cardiac rehab on 08/19/21.MBarnet Pall RN,BSN 08/16/2021 4:07 PM

## 2021-08-17 ENCOUNTER — Other Ambulatory Visit: Payer: Self-pay

## 2021-08-17 ENCOUNTER — Encounter (HOSPITAL_COMMUNITY): Payer: Medicare Other

## 2021-08-17 ENCOUNTER — Ambulatory Visit (HOSPITAL_BASED_OUTPATIENT_CLINIC_OR_DEPARTMENT_OTHER): Payer: Medicare Other | Admitting: Cardiology

## 2021-08-17 ENCOUNTER — Encounter (HOSPITAL_COMMUNITY)
Admission: RE | Admit: 2021-08-17 | Discharge: 2021-08-17 | Disposition: A | Discharge status: Home / Self Care | Payer: Medicare Other | Source: Ambulatory Visit | Attending: Interventional Cardiology | Admitting: Interventional Cardiology

## 2021-08-17 DIAGNOSIS — G4733 Obstructive sleep apnea (adult) (pediatric): Secondary | ICD-10-CM | POA: Insufficient documentation

## 2021-08-17 DIAGNOSIS — Z955 Presence of coronary angioplasty implant and graft: Secondary | ICD-10-CM | POA: Diagnosis not present

## 2021-08-18 NOTE — Procedures (Signed)
   Patient Name: Randy Hudson, Randy Hudson Date: 08/17/2021 Gender: Male D.O.B: [birth year] Age (years): 89 Referring Provider: [name] Height (inches): 65 Interpreting Physician: Fransico Him MD, ABSM Weight (lbs): 172 RPSGT: Carolin Coy BMI: 29 MRN: [id number] Neck Size: 15.50  CLINICAL INFORMATION The patient is referred for a CPAP titration to treat sleep apnea.  SLEEP STUDY TECHNIQUE As per the AASM Manual for the Scoring of Sleep and Associated Events v2.3 (April 2016) with a hypopnea requiring 4% desaturations.  The channels recorded and monitored were frontal, central and occipital EEG, electrooculogram (EOG), submentalis EMG (chin), nasal and oral airflow, thoracic and abdominal wall motion, anterior tibialis EMG, snore microphone, electrocardiogram, and pulse oximetry. Continuous positive airway pressure (CPAP) was initiated at the beginning of the study and titrated to treat sleep-disordered breathing.  MEDICATIONS Medications self-administered by patient taken the night of the study : N/A  TECHNICIAN COMMENTS Comments added by technician: Patient was ordered as a cpap titration Comments added by scorer: N/A  RESPIRATORY PARAMETERS Optimal PAP Pressure (cm): N/A  AHI at Optimal Pressure (/hr):N/A Overall Minimal O2 (%):91.0  Supine % at Optimal Pressure (%):N/A Minimal O2 at Optimal Pressure (%): N/A   SLEEP ARCHITECTURE The study was initiated at 10:08:31 PM and ended at 4:59:27 AM.  Sleep onset time was 24.8 minutes and the sleep efficiency was 47.1%. The total sleep time was 193.5 minutes.  The patient spent 10.1% of the night in stage N1 sleep, 75.7% in stage N2 sleep, 3.1% in stage N3 and 11.1% in REM.Stage REM latency was 86.0 minutes  Wake after sleep onset was 192.7. Alpha intrusion was absent. Supine sleep was 24.81%.  CARDIAC DATA The 2 lead EKG demonstrated sinus rhythm. The mean heart rate was 61.2 beats per minute. Other EKG findings  include: None.  LEG MOVEMENT DATA The total Periodic Limb Movements of Sleep (PLMS) were 0. The PLMS index was 0.0. A PLMS index of <15 is considered normal in adults.  IMPRESSIONS - The optimal PAP pressure could not be obtained due to ongoing respiratory events.  - Central sleep apnea was not noted during this titration (CAI = 4/h). - Significant oxygen desaturations were not observed during this titration (min O2 = 91.0%). - The patient snored with soft snoring volume during this titration study. - No cardiac abnormalities were observed during this study. - Clinically significant periodic limb movements were not noted during this study. Arousals associated with PLMs were rare.  DIAGNOSIS - Obstructive Sleep Apnea (G47.33) with unsuccessful CPAP titration.   RECOMMENDATIONS - Repeat in lab study with BiPAP titration - Avoid alcohol, sedatives and other CNS depressants that may worsen sleep apnea and disrupt normal sleep architecture. - Sleep hygiene should be reviewed to assess factors that may improve sleep quality. - Weight management and regular exercise should be initiated or continued.  [Electronically signed] 08/18/2021 01:24 PM  Fransico Him MD, ABSM Diplomate, American Board of Sleep Medicine

## 2021-08-19 ENCOUNTER — Encounter (HOSPITAL_COMMUNITY): Payer: Medicare Other

## 2021-08-19 ENCOUNTER — Encounter (HOSPITAL_COMMUNITY)
Admission: RE | Admit: 2021-08-19 | Discharge: 2021-08-19 | Disposition: A | Discharge status: Home / Self Care | Payer: Medicare Other | Source: Ambulatory Visit | Attending: Interventional Cardiology | Admitting: Interventional Cardiology

## 2021-08-19 ENCOUNTER — Encounter: Payer: Self-pay | Admitting: Interventional Cardiology

## 2021-08-19 ENCOUNTER — Other Ambulatory Visit: Payer: Self-pay

## 2021-08-19 DIAGNOSIS — Z955 Presence of coronary angioplasty implant and graft: Secondary | ICD-10-CM | POA: Diagnosis not present

## 2021-08-19 DIAGNOSIS — E785 Hyperlipidemia, unspecified: Secondary | ICD-10-CM | POA: Diagnosis not present

## 2021-08-19 NOTE — Progress Notes (Signed)
Discharge Progress Report  Patient Details  Name: Randy Hudson MRN: [id number] Date of Birth: 05/13/1946 Referring Provider:   Flowsheet Row CARDIAC REHAB PHASE II ORIENTATION from 06/23/2021 in [place]  Referring Provider dr. [name], MD        Number of Visits: 22  Reason for Discharge:  Patient reached a stable level of exercise. Patient independent in their exercise. Patient has met program and personal goals.  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 [name], MD    Expected Discharge Date 08/19/21      Recumbant Bike   Level 1.5    Minutes 15    METs 1.9      NuStep   Level 2    SPM 75    Minutes 15    METs 1.9      Prescription Details   Frequency (times per week) 3    Duration Progress to 30 minutes of continuous aerobic without signs/symptoms of physical distress      Intensity   THRR 40-80% of Max Heartrate 59-118    Ratings of Perceived Exertion 11-13    Perceived Dyspnea 0-4      Progression   Progression Continue progressive overload as per policy without signs/symptoms or physical distress.      Resistance Training   Training Prescription Yes    Weight 3    Reps 10-15             Discharge Exercise Prescription (Final Exercise Prescription Changes):  Exercise Prescription Changes - 08/19/21 0830       Response to Exercise   Blood Pressure (Admit) 138/80    Blood Pressure (Exercise) 128/66    Blood Pressure (Exit) 106/68    Heart Rate (Admit) 71 bpm    Heart Rate (Exercise) 97 bpm    Heart Rate (Exit) 79 bpm    Rating of  Perceived Exertion (Exercise) 13    Perceived Dyspnea (Exercise) 0    Symptoms 0    Comments Pt graduated the CRP2 program    Duration Progress to 30 minutes of  aerobic without signs/symptoms of physical distress    Intensity THRR unchanged      Progression   Progression Continue to progress workloads to maintain intensity without signs/symptoms of physical distress.    Average METs 2.85      Resistance Training   Training Prescription Yes    Weight 4    Reps 10-15    Time 10 Minutes      Recumbant Bike   Level 3.5    Minutes 15    METs 2.8      NuStep   Level 4    SPM 95    Minutes 15    METs 2.9      Home Exercise Plan   Plans to continue exercise at Home (comment)    Frequency Add 2 additional days to program exercise sessions.    Initial Home Exercises Provided 07/13/21             Functional Capacity:  6 Minute Walk  [place] Name 06/23/21 0951 08/15/21 0830       6 Minute Walk   Phase Initial Discharge    Distance 1200 feet 1298 feet    Distance % Change -- 8.17 %    Distance Feet Change -- 98 ft    Walk Time 6 minutes 6 minutes    # of Rest Breaks 0 0    MPH 2.27 2.46    METS 2.36 2.76    RPE 9 11    Perceived Dyspnea  0 0    VO2 Peak 8.26 9.66    Symptoms No No    Resting HR 71 bpm 67 bpm    Resting BP 120/62 120/64    Resting Oxygen Saturation  97 % 97 %    Exercise Oxygen Saturation  during 6 min walk 98 % 98 %    Max Ex. HR 90 bpm 110 bpm    Max Ex. BP 122/72 128/62    2 Minute Post BP 120/70 --             Psychological, QOL, Others - Outcomes: PHQ 2/9: Depression screen [facility] 2/9 08/19/2021 06/23/2021  Decreased Interest 0 0  Down, Depressed, Hopeless 0 0  PHQ - 2 Score 0 0    Quality of Life:  Quality of Life - 08/17/21 0830       Quality of Life   Select Quality of Life      Quality of Life Scores   Health/Function Post 28.8 %    Socioeconomic Post 30 %    Psych/Spiritual Post 30 %    Family Post 28.8 %    GLOBAL  Post 29.31 %             Personal Goals: Goals established at orientation with interventions provided to work toward goal.  Personal Goals and Risk Factors at Admission - 06/23/21 1043       Core Components/Risk Factors/Patient Goals on Admission    Weight Management Yes;Weight Loss    Intervention Weight Management: Develop a combined nutrition and exercise program designed to reach desired caloric intake, while maintaining appropriate intake of nutrient and fiber, sodium and fats, and appropriate energy expenditure required for the weight goal.;Weight Management: Provide education and appropriate resources to help participant work on and attain dietary goals.;Weight Management/Obesity: Establish reasonable short term and long term weight goals.;Obesity: Provide education and appropriate resources to help participant work on and attain dietary goals.    Admit Weight 173 lb 4.5 oz (78.6 kg)    Expected Outcomes Short Term: Continue to assess and modify interventions until short term weight is achieved;Long Term: Adherence to nutrition and physical activity/exercise program aimed toward attainment of established weight goal;Understanding recommendations for meals to include 15-35% energy as protein, 25-35% energy from fat, 35-60% energy from carbohydrates, less than 269m of dietary cholesterol, 20-35 gm of total fiber daily;Understanding of distribution of calorie intake throughout the day with the consumption of 4-5 meals/snacks    Hypertension Yes    Intervention Provide education on lifestyle modifcations including regular physical activity/exercise, weight management, moderate sodium restriction and increased consumption of fresh fruit, vegetables, and low fat dairy, alcohol moderation, and smoking cessation.;Monitor prescription use compliance.    Expected Outcomes Short Term: Continued assessment and intervention until BP is < 140/967mHG in hypertensive participants. < 130/8014mG in  hypertensive participants with diabetes, heart failure or chronic kidney disease.;Long Term: Maintenance of blood pressure at goal levels.    Lipids Yes  Intervention Provide education and support for participant on nutrition & aerobic/resistive exercise along with prescribed medications to achieve LDL <83m, HDL >470m    Expected Outcomes Short Term: Participant states understanding of desired cholesterol values and is compliant with medications prescribed. Participant is following exercise prescription and nutrition guidelines.;Long Term: Cholesterol controlled with medications as prescribed, with individualized exercise RX and with personalized nutrition plan. Value goals: LDL < 7016mHDL > 40 mg.    Personal Goal Other Yes    Personal Goal Short term: walk more and be healthier. Long term: gain strength, energy and lose wt    Intervention Will continue to monitor pt and progress workloads as tolerated without sign or symptom.    Expected Outcomes Pt will achieve his goals              Personal Goals Discharge:  Goals and Risk Factor Review     Row Name 07/20/21 0826967/29/22 1604           Core Components/Risk Factors/Patient Goals Review   Personal Goals Review Weight Management/Obesity;Lipids;Hypertension;Other  Increase strength and stamina Weight Management/Obesity;Lipids;Hypertension;Other  Increase strength and stamina      Review Patient continues to have a goal of losing some weight and understands healthy food choices, portion control and exercise is the pathway to achieve goals. He reports feeling stronger and his stamina is improving. Patient continues to have a goal of losing some weight and understands healthy food choices, portion control and exercise is the pathway to achieve goals. He reports feeling stronger and his stamina is improving. JohKeanell complete cardiac rehab on 08/19/21.      Expected Outcomes Patient will continue to participate in Cardiac rehab for  exercise and positive lifestyle modification. Patient will continue to  exercise and follow  positive lifestyle modifications upon completion of phase 2 cardiac rehab.               Exercise Goals and Review:  Exercise Goals     Row Name 06/23/21 1030             Exercise Goals   Increase Physical Activity Yes       Intervention Provide advice, education, support and counseling about physical activity/exercise needs.;Develop an individualized exercise prescription for aerobic and resistive training based on initial evaluation findings, risk stratification, comorbidities and participant's personal goals.       Expected Outcomes Short Term: Attend rehab on a regular basis to increase amount of physical activity.;Long Term: Add in home exercise to make exercise part of routine and to increase amount of physical activity.;Long Term: Exercising regularly at least 3-5 days a week.       Increase Strength and Stamina Yes       Intervention Provide advice, education, support and counseling about physical activity/exercise needs.;Develop an individualized exercise prescription for aerobic and resistive training based on initial evaluation findings, risk stratification, comorbidities and participant's personal goals.       Expected Outcomes Short Term: Increase workloads from initial exercise prescription for resistance, speed, and METs.;Short Term: Perform resistance training exercises routinely during rehab and add in resistance training at home;Long Term: Improve cardiorespiratory fitness, muscular endurance and strength as measured by increased METs and functional capacity (6MWT)       Able to understand and use rate of perceived exertion (RPE) scale Yes       Intervention Provide education and explanation on how to use RPE scale       Expected Outcomes Short  Term: Able to use RPE daily in rehab to express subjective intensity level;Long Term:  Able to use RPE to guide intensity level when  exercising independently       Knowledge and understanding of Target Heart Rate Range (THRR) Yes       Intervention Provide education and explanation of THRR including how the numbers were predicted and where they are located for reference       Expected Outcomes Short Term: Able to state/look up THRR;Long Term: Able to use THRR to govern intensity when exercising independently;Short Term: Able to use daily as guideline for intensity in rehab       Understanding of Exercise Prescription Yes       Intervention Provide education, explanation, and written materials on patient's individual exercise prescription       Expected Outcomes Short Term: Able to explain program exercise prescription;Long Term: Able to explain home exercise prescription to exercise independently                Exercise Goals Re-Evaluation:  Exercise Goals Re-Evaluation     Row Name 06/27/21 0825 07/13/21 0818 07/20/21 0818 07/27/21 0830 08/08/21 0830     Exercise Goal Re-Evaluation   Exercise Goals Review Increase Physical Activity;Increase Strength and Stamina;Able to understand and use rate of perceived exertion (RPE) scale;Knowledge and understanding of Target Heart Rate Range (THRR);Understanding of Exercise Prescription Increase Physical Activity;Increase Strength and Stamina;Able to understand and use rate of perceived exertion (RPE) scale;Knowledge and understanding of Target Heart Rate Range (THRR);Understanding of Exercise Prescription Increase Physical Activity;Increase Strength and Stamina;Able to understand and use rate of perceived exertion (RPE) scale;Knowledge and understanding of Target Heart Rate Range (THRR);Understanding of Exercise Prescription Increase Physical Activity;Increase Strength and Stamina;Able to understand and use rate of perceived exertion (RPE) scale;Knowledge and understanding of Target Heart Rate Range (THRR);Understanding of Exercise Prescription Increase Physical Activity;Increase  Strength and Stamina;Able to understand and use rate of perceived exertion (RPE) scale;Knowledge and understanding of Target Heart Rate Range (THRR);Understanding of Exercise Prescription   Comments Pt first day of exercise in the CRP2 program. Pt tolerated exercise well with an average MET level of 2.0. Pt is off to a good start Reviewed MET's, goals and home Ex Rx. Pt tolerated exercise well with an average MET level of 2.15. Pt feels like he is getting stronger and has more energy since starting CRP2. He states that he is also able to walk longer distances and was able to be a chaperone for a field trip with his family yesterday. He will start walking 2 days a week for 30-45 minutes a session to help increase his cardiorespiratory fitness. Patient making good progress with exercise. Patient would like to achieve 3.0 METs. Increased workload on recumbent bike from 2.0 to 2.5, and patient tolerated well. Patient would like to increase to from 2.5 to 3.0 on Friday. Reviewed MET's with pt today. Pt in tolerating exercise well with an average MET level of 2.9. Reviewed MET's and goals with pt today. Pt in tolerating exercise well with an average MET level of 2.8. Pt feels like he is gaining more energy and strength. Pt also feels like his ADL's have become easier and he can walk longer distances when weather allows   Expected Outcomes Will continue to monitor pt and progress workloads as tolerated without sign or symptom Pt will add in 2 days walking for 30-45 minutes per session. Will continue to monitor pt and progress workloads as tolerated without sign or symptom  Continue to progress workloads as tolerated to help increase MET level with exercise. Continue to progress workloads as tolerated Will continue to monitor pt and progress workloads as tolerated without sign or symptom    Row Name 08/19/21 0830             Exercise Goal Re-Evaluation   Exercise Goals Review Increase Physical Activity;Increase  Strength and Stamina;Able to understand and use rate of perceived exertion (RPE) scale;Knowledge and understanding of Target Heart Rate Range (THRR);Understanding of Exercise Prescription       Comments Pt graduated the [place] program today. Pt progressed well in the program and had an average MET level of 2.85. Pt will continue to exercise on his own by walking 5-7 days a week for 30-45 minutes a session       Expected Outcomes Pt will continue to exercise on his own at home                Nutrition & Weight - Outcomes:  Pre Biometrics - 06/23/21 1028       Pre Biometrics   Waist Circumference 39 inches    Hip Circumference 38.5 inches    Waist to Hip Ratio 1.01 %    Triceps Skinfold 13 mm    % Body Fat 27 %    Grip Strength 42 kg    Flexibility --   pt unable to reach   Single Leg Stand 4.4 seconds             Post Biometrics - 08/15/21 0830        Post  Biometrics   Height _0  (1.676 m)    Weight 78.7 kg    Waist Circumference 39.5 inches    Hip Circumference 39 inches    Waist to Hip Ratio 1.01 %    BMI (Calculated) 28.02    Triceps Skinfold 11 mm    % Body Fat 26.6 %    Grip Strength 43 kg    Flexibility 5 in    Single Leg Stand 4.6 seconds             Nutrition:   Nutrition Discharge:   Education Questionnaire Score:  Knowledge Questionnaire Score - 08/17/21 0830       Knowledge Questionnaire Score   Post Score 24/24             Osias completed cardiac rehab program today. He progressed nicely through the program. Goals reviewed. Nickalus plans to continue home exercise focusing on walking and the warm-up /cool-down exercises he learned from the program. He feels good about the progress he has made so far with the program participation. PHQ-9 score=0. No changes to medications.  Dr. Fransico Him, Medical Director for Cardiac Rehabiliation

## 2021-08-20 ENCOUNTER — Telehealth: Payer: Self-pay | Admitting: *Deleted

## 2021-08-20 DIAGNOSIS — G4733 Obstructive sleep apnea (adult) (pediatric): Secondary | ICD-10-CM

## 2021-08-20 NOTE — Telephone Encounter (Signed)
-----   Message from Sueanne Margarita, MD sent at 08/18/2021  1:26 PM EST ----- Unsuccessful CPAP titration due to ongoing respiratory events.  Please set patient up for in lab BiPAP titration

## 2021-08-22 NOTE — Telephone Encounter (Addendum)
The patient has been notified of the result and verbalized understanding.  All questions (if any) were answered. Randy Hudson, Lam[place] 08/24/2021 2:26 PM   Patient understands his sleep study showed they have sleep apnea and recommend BiPAP titration.  Please set up titration in the sleep lab.  Pt is aware of her/his results Titration to be scheduled.

## 2021-08-24 ENCOUNTER — Telehealth: Payer: Self-pay | Admitting: Cardiology

## 2021-08-24 NOTE — Telephone Encounter (Signed)
Follow Up:     Patient said he wanted to talk to Dr Radford Pax or her nurse about the results of his Sleep Test that was on My Chart.

## 2021-08-31 NOTE — Telephone Encounter (Signed)
Reached out to the patient and answered all questions and confirmed his new study date was ok for him.

## 2021-09-02 ENCOUNTER — Other Ambulatory Visit (HOSPITAL_COMMUNITY): Payer: Self-pay | Admitting: *Deleted

## 2021-09-05 ENCOUNTER — Encounter (HOSPITAL_COMMUNITY)
Admission: RE | Admit: 2021-09-05 | Discharge: 2021-09-05 | Disposition: A | Discharge status: Home / Self Care | Payer: Medicare Other | Source: Ambulatory Visit | Attending: Interventional Cardiology | Admitting: Interventional Cardiology

## 2021-09-05 ENCOUNTER — Other Ambulatory Visit: Payer: Self-pay

## 2021-09-05 DIAGNOSIS — E785 Hyperlipidemia, unspecified: Secondary | ICD-10-CM | POA: Diagnosis not present

## 2021-09-05 DIAGNOSIS — Z955 Presence of coronary angioplasty implant and graft: Secondary | ICD-10-CM | POA: Diagnosis not present

## 2021-09-05 MED ORDER — INCLISIRAN SODIUM 284 MG/1.5ML ~~LOC~~ SOSY: 284.0000 mg | PREFILLED_SYRINGE | Freq: Once | SUBCUTANEOUS | Status: AC

## 2021-09-05 MED ORDER — INCLISIRAN SODIUM 284 MG/1.5ML ~~LOC~~ SOSY
PREFILLED_SYRINGE | SUBCUTANEOUS | Status: AC
  Administered 2021-09-05: 08:00:00 284 mg via SUBCUTANEOUS
  Filled 2021-09-05: qty 1.5

## 2021-09-08 ENCOUNTER — Encounter (HOSPITAL_BASED_OUTPATIENT_CLINIC_OR_DEPARTMENT_OTHER): Payer: Self-pay | Admitting: Cardiology

## 2021-09-08 ENCOUNTER — Other Ambulatory Visit: Payer: Self-pay

## 2021-09-08 DIAGNOSIS — E785 Hyperlipidemia, unspecified: Secondary | ICD-10-CM

## 2021-09-15 NOTE — Telephone Encounter (Signed)
Patient is scheduled for BIPAP Titration on 10-07-20. Patient understands his titration study will be done at [facility] sleep lab. Patient understands he will receive a letter in a week or so detailing appointment, date, time, and location.

## 2021-10-03 ENCOUNTER — Other Ambulatory Visit: Payer: Self-pay | Admitting: Cardiology

## 2021-10-07 ENCOUNTER — Other Ambulatory Visit: Payer: Self-pay

## 2021-10-07 ENCOUNTER — Ambulatory Visit (HOSPITAL_BASED_OUTPATIENT_CLINIC_OR_DEPARTMENT_OTHER): Payer: Medicare Other | Attending: Cardiology | Admitting: Cardiology

## 2021-10-07 DIAGNOSIS — G4733 Obstructive sleep apnea (adult) (pediatric): Secondary | ICD-10-CM | POA: Diagnosis not present

## 2021-10-08 DIAGNOSIS — Z23 Encounter for immunization: Secondary | ICD-10-CM | POA: Diagnosis not present

## 2021-10-09 NOTE — Procedures (Signed)
° °  Patient Name: Randy Hudson, Knoth Date: 10/07/2021 Gender: Male D.O.B: [birth year] Age (years): 28 Referring Provider: [name] Height (inches): 65 Interpreting Physician: Fransico Him MD, ABSM Weight (lbs): 172 RPSGT: Jorge Ny BMI: 29 MRN: [id number] Neck Size: 15.50  CLINICAL INFORMATION The patient is referred for a BiPAP titration to treat sleep apnea.  SLEEP STUDY TECHNIQUE As per the AASM Manual for the Scoring of Sleep and Associated Events v2.3 (April 2016) with a hypopnea requiring 4% desaturations.  The channels recorded and monitored were frontal, central and occipital EEG, electrooculogram (EOG), submentalis EMG (chin), nasal and oral airflow, thoracic and abdominal wall motion, anterior tibialis EMG, snore microphone, electrocardiogram, and pulse oximetry. Bilevel positive airway pressure (BPAP) was initiated at the beginning of the study and titrated to treat sleep-disordered breathing.  MEDICATIONS Medications self-administered by patient taken the night of the study : N/A  RESPIRATORY PARAMETERS Optimal IPAP Pressure (cm): 13  AHI at Optimal Pressure (/hr) 6.2 Optimal EPAP Pressure (cm):9  Overall Minimal O2 (%):86.0  Minimal O2 at Optimal Pressure (%): 89.0  SLEEP ARCHITECTURE Start Time:10:07:15 PM  Stop Time:4:41:45 AM  Total Time (min):394.5  Total Sleep Time (min):186 Sleep Latency (min):11.5  Sleep Efficiency (%):47.1%  REM Latency (min):75.5  WASO (min): 197.0 Stage N1 (%):9.9%  Stage N2 (%): 84.1%  Stage N3 (%): 0.0%  Stage R (%):5.9 Supine (%):0.00  Arousal Index (/hr):21.6   CARDIAC DATA The 2 lead EKG demonstrated sinus rhythm. The mean heart rate was 59.2 beats per minute. Other EKG findings include: None.  LEG MOVEMENT DATA The total Periodic Limb Movements of Sleep (PLMS) were 0. The PLMS index was 0.0. A PLMS index of <15 is considered normal in adults.  IMPRESSIONS - An optimal PAP pressure was selected for  this patient ( 13/9 cm of water) - Central sleep apnea was not noted during this titration (CAI = 3.9/h). - Moderate oxygen desaturations were observed during this titration (min O2 = 86.0%). - The patient snored with soft snoring volume. - No cardiac abnormalities were observed during this study. - Clinically significant periodic limb movements were not noted during this study. Arousals associated with PLMs were rare.  DIAGNOSIS - Obstructive Sleep Apnea (G47.33)  RECOMMENDATIONS - Trial of auto BiPAP therapy with IPAP max 20cm H2O, EPAP min 5cm H2O and PS 4cm H2O with a Medium size Resmed Full Face Mask Quattro FX mask and heated humidification. - Avoid alcohol, sedatives and other CNS depressants that may worsen sleep apnea and disrupt normal sleep architecture. - Sleep hygiene should be reviewed to assess factors that may improve sleep quality. - Weight management and regular exercise should be initiated or continued. - Return to Sleep Center for re-evaluation after 6 weeks of therapy  [Electronically signed] 10/09/2021 12:58 PM  Fransico Him MD, ABSM Diplomate, American Board of Sleep Medicine

## 2021-10-13 ENCOUNTER — Telehealth: Payer: Self-pay | Admitting: *Deleted

## 2021-10-13 DIAGNOSIS — G4733 Obstructive sleep apnea (adult) (pediatric): Secondary | ICD-10-CM

## 2021-10-13 NOTE — Telephone Encounter (Signed)
DME selection is [facility]. Patient understands he will be contacted by [facility] to set up his cpap. Patient understands to call if [facility] does not contact him with new setup in a timely manner. Patient understands they will be called once confirmation has been received from [facility]/ that they have received their new machine to schedule 10 week follow up appointment.   [facility] notified of new cpap order  Please add to airview Patient was grateful for the call and thanked me. 

## 2021-10-13 NOTE — Telephone Encounter (Signed)
Per dr Radford Pax, Trial of auto BiPAP therapy with IPAP max 20cm H2O, EPAP min 5cm H2O and PS 4cm H2O with a Medium size Resmed Full Face Mask Quattro FX mask and heated humidification.

## 2021-10-23 ENCOUNTER — Encounter: Payer: Self-pay | Admitting: Interventional Cardiology

## 2021-10-25 ENCOUNTER — Encounter: Payer: Self-pay | Admitting: *Deleted

## 2021-10-25 NOTE — Telephone Encounter (Signed)
-----   Message from Cleon Gustin, [place] sent at 10/12/2021  7:09 PM EST -----  ----- Message ----- From: Sueanne Margarita, MD Sent: 10/09/2021   1:02 PM EST To: Cv Div Sleep Studies  Please let patient know that they had a successful PAP titration and let DME know that orders are in EPIC.  Please set up 6 week OV with me.

## 2021-10-25 NOTE — Telephone Encounter (Signed)
This encounter was created in error - please disregard.

## 2021-10-26 DIAGNOSIS — H353122 Nonexudative age-related macular degeneration, left eye, intermediate dry stage: Secondary | ICD-10-CM | POA: Diagnosis not present

## 2021-10-26 DIAGNOSIS — H43813 Vitreous degeneration, bilateral: Secondary | ICD-10-CM | POA: Diagnosis not present

## 2021-10-26 DIAGNOSIS — H33193 Other retinoschisis and retinal cysts, bilateral: Secondary | ICD-10-CM | POA: Diagnosis not present

## 2021-10-26 DIAGNOSIS — H34831 Tributary (branch) retinal vein occlusion, right eye, with macular edema: Secondary | ICD-10-CM | POA: Diagnosis not present

## 2021-11-08 NOTE — Progress Notes (Signed)
Cardiology Office Note   Date:  11/10/2021   ID:  Randy Hudson, DOB 01/10/1946, MRN [id number]  PCP:  Kathyrn Lass, MD    Chief Complaint  Patient presents with   Follow-up    Wants to know if he needs to carry nitrostat     Wt Readings from Last 3 Encounters:  11/10/21 174 lb (78.9 kg)  10/07/21 170 lb (77.1 kg)  08/17/21 172 lb (78 kg)       History of Present Illness: Randy Hudson is a 76 y.o. male   with family h/o CAD.   Son had CABG in 2022 at [facility]. Father had AVR.  Older brother had CABG at age 34. THey were all smokers.   Patient does not smoke.   Had an abnormal coronary CTA.  Cardiac cath 03/2025 was done: "Prox RCA lesion is 40% stenosed.   Mid RCA lesion is 90% stenosed.  This is a relatively small vessel.   RPDA lesion is 100% stenosed.  Left-to-right collaterals feeding the PDA.   3rd Mrg lesion is 50% stenosed.   Prox LAD lesion is 40% stenosed.   Mid LAD lesion is 90% stenosed.   A drug-eluting stent was successfully placed using a STENT ONYX FRONTIER 3.0X26, postdilated to 3.5 mm.   Post intervention, there is a 0% residual stenosis.   Dist LAD lesion is 40% stenosed.   The left ventricular systolic function is normal.   The left ventricular ejection fraction is 55-65% by visual estimate.   There is no aortic valve stenosis.   Significant tortuosity noted in the aorta leading to the ascending aorta may catheter torquing difficult.  Consider destination sheath if repeat cath performed from right radial.  The tortuosity made guide support difficult as well.     Plan for medical therapy of RCA given PDA is occluded with collaterals.  Would plan for clopidogrel monotherapy after his course of dual antiplatelet therapy given the diffuse disease in the other vessels."  Denies : Chest pain. Dizziness. Leg edema. Nitroglycerin use. Orthopnea. Palpitations. Paroxysmal nocturnal dyspnea. Shortness of breath. Syncope.    Walks regularly, 2x/week.     Past Medical History:  Diagnosis Date   Hyperlipidemia    Hypertension     Past Surgical History:  Procedure Laterality Date   CORONARY STENT INTERVENTION N/A 04/12/2021   Procedure: CORONARY STENT INTERVENTION;  Surgeon: Jettie Booze, MD;  Location: [facility];  Service: Cardiovascular;  Laterality: N/A;   LEFT HEART CATH AND CORONARY ANGIOGRAPHY N/A 04/12/2021   Procedure: LEFT HEART CATH AND CORONARY ANGIOGRAPHY;  Surgeon: Jettie Booze, MD;  Location: [facility];  Service: Cardiovascular;  Laterality: N/A;     Current Outpatient Medications  Medication Sig Dispense Refill   Aflibercept (EYLEA IO) Inject 1 Dose into the eye every 3 (three) months.     aspirin EC 81 MG tablet Take 1 tablet (81 mg total) by mouth daily. Swallow whole. 90 tablet 3   atorvastatin (LIPITOR) 80 MG tablet Take 80 mg by mouth in the morning.     carboxymethylcellul-glycerin (LUBRICANT DROPS/DUAL-ACTION) 0.5-0.9 % ophthalmic solution Place 1 drop into both eyes 3 (three) times daily as needed (dry/irritated eyes.).     Cholecalciferol (VITAMIN D) 50 MCG (2000 UT) tablet Take 2,000 Units by mouth in the morning.     clopidogrel (PLAVIX) 75 MG tablet Take 1 tablet (75 mg total) by mouth daily with breakfast. 90 tablet 3   inclisiran (  LEQVIO) 284 MG/1.5ML SOSY injection Inject 284 mg into the skin as directed. Administered subcutaneously every 6 months     metoprolol succinate (TOPROL-XL) 25 MG 24 hr tablet TAKE 1 TABLET BY MOUTH DAILY. TAKE WITH OR IMMEDIATELY FOLLOWING A MEAL. 90 tablet 2   Multiple Vitamins-Minerals (PRESERVISION AREDS 2+MULTI VIT PO) Take 1 capsule by mouth 2 (two) times daily.     nitroGLYCERIN (NITROSTAT) 0.4 MG SL tablet Place 1 tablet (0.4 mg total) under the tongue every 5 (five) minutes as needed for chest pain. 25 tablet 6   tobramycin (TOBREX) 0.3 % ophthalmic solution Place 1 drop into the right eye as directed. Instill 1 drop into right eye 4 times  daily the day before, the day of & the day following eylea intravitreal injection     triamterene-hydrochlorothiazide (DYAZIDE) 37.5-25 MG capsule Take 1 capsule by mouth every morning.     No current facility-administered medications for this visit.    Allergies:   Patient has no known allergies.    Social History:  The patient  reports that he has never smoked. He has never used smokeless tobacco.   Family History:  The patient's family history includes Colon cancer in his father; Heart disease in his father.    ROS:  Please see the history of present illness.   Otherwise, review of systems are positive for easy bruising.   All other systems are reviewed and negative.    PHYSICAL EXAM: VS:  BP 130/74    Pulse 62    Ht 5\' 5"  (1.651 m)    Wt 174 lb (78.9 kg)    SpO2 97%    BMI 28.96 kg/m  , BMI Body mass index is 28.96 kg/m. GEN: Well nourished, well developed, in no acute distress HEENT: normal Neck: no JVD, carotid bruits, or masses Cardiac: RRR; no murmurs, rubs, or gallops,no edema  Respiratory:  clear to auscultation bilaterally, normal work of breathing GI: soft, nontender, nondistended, + BS MS: no deformity or atrophy Skin: warm and dry, no rash Neuro:  Strength and sensation are intact Psych: euthymic mood, full affect   Recent Labs: 04/13/2021: BUN 26; Creatinine, Ser 1.05; Hemoglobin 14.2; Platelets 279; Potassium 4.6; Sodium 137   Lipid Panel No results found for: CHOL, TRIG, HDL, CHOLHDL, VLDL, LDLCALC, LDLDIRECT   Other studies Reviewed: Additional studies/ records that were reviewed today with results demonstrating: labs reviewed.   ASSESSMENT AND PLAN:  CAD: s/p LAD PCI.  CTO of PDA. No angina on medical therapy. Stop aspirin and continue Plavix. Incresae exercise to target below.  HTN: Avoid processed foods.  Low salt diet.  Hyperlipidemia: whole food, plant based diet. LDL 20.  TG 266. Continue leqvio.  COntinue atorvastatin.  Lipid clinic. OSA:  Starting BiPap.  Will check with Gae Bon regarding BiPap settings.     Current medicines are reviewed at length with the patient today.  The patient concerns regarding his medicines were addressed.  The following changes have been made:  No change  Labs/ tests ordered today include:  No orders of the defined types were placed in this encounter.   Recommend 150 minutes/week of aerobic exercise Low fat, low carb, high fiber diet recommended  Disposition:   FU in 1 year   Signed, [name], MD  11/10/2021 8:16 AM    [facility] [place], [place], [place]  [address] Phone: [phone]; Fax: [phone]

## 2021-11-10 ENCOUNTER — Ambulatory Visit (INDEPENDENT_AMBULATORY_CARE_PROVIDER_SITE_OTHER): Payer: Medicare Other | Admitting: Interventional Cardiology

## 2021-11-10 ENCOUNTER — Other Ambulatory Visit: Payer: Self-pay

## 2021-11-10 ENCOUNTER — Encounter: Payer: Self-pay | Admitting: Interventional Cardiology

## 2021-11-10 VITALS — BP 130/74 | HR 62 | Ht 65.0 in | Wt 174.0 lb

## 2021-11-10 DIAGNOSIS — G4733 Obstructive sleep apnea (adult) (pediatric): Secondary | ICD-10-CM | POA: Diagnosis not present

## 2021-11-10 DIAGNOSIS — I25118 Atherosclerotic heart disease of native coronary artery with other forms of angina pectoris: Secondary | ICD-10-CM | POA: Diagnosis not present

## 2021-11-10 DIAGNOSIS — I10 Essential (primary) hypertension: Secondary | ICD-10-CM

## 2021-11-10 DIAGNOSIS — E782 Mixed hyperlipidemia: Secondary | ICD-10-CM

## 2021-11-10 NOTE — Patient Instructions (Signed)
Medication Instructions:  Your physician has recommended you make the following change in your medication: Stop aspirin  *If you need a refill on your cardiac medications before your next appointment, please call your pharmacy*   Lab Work: none If you have labs (blood work) drawn today and your tests are completely normal, you will receive your results only by: [place] (if you have MyChart) OR A paper copy in the mail If you have any lab test that is abnormal or we need to change your treatment, we will call you to review the results.   Testing/Procedures: none   Follow-Up: At [facility], you and your health needs are our priority.  As part of our continuing mission to provide you with exceptional heart care, we have created designated Provider Care Teams.  These Care Teams include your primary Cardiologist (physician) and Advanced Practice Providers (APPs -  Physician Assistants and Nurse Practitioners) who all work together to provide you with the care you need, when you need it.  We recommend signing up for the patient portal called "MyChart".  Sign up information is provided on this After Visit Summary.  MyChart is used to connect with patients for Virtual Visits (Telemedicine).  Patients are able to view lab/test results, encounter notes, upcoming appointments, etc.  Non-urgent messages can be sent to your provider as well.   To learn more about what you can do with MyChart, go to [email or web address].    Your next appointment:   12 month(s)  The format for your next appointment:   In Person  Provider:   [name], MD     Other Instructions High-Fiber Eating Plan Fiber, also called dietary fiber, is a type of carbohydrate. It is found foods such as fruits, vegetables, whole grains, and beans. A high-fiber diet can have many health benefits. Your health care provider may recommend a high-fiber diet to help: Prevent constipation. Fiber can make your  bowel movements more regular. Lower your cholesterol. Relieve the following conditions: Inflammation of veins in the anus (hemorrhoids). Inflammation of specific areas of the digestive tract (uncomplicated diverticulosis). A problem of the large intestine, also called the colon, that sometimes causes pain and diarrhea (irritable bowel syndrome, or IBS). Prevent overeating as part of a weight-loss plan. Prevent heart disease, type 2 diabetes, and certain cancers. What are tips for following this plan? Reading food labels  Check the nutrition facts label on food products for the amount of dietary fiber. Choose foods that have 5 grams of fiber or more per serving. The goals for recommended daily fiber intake include: Men (age 70 or younger): 34-38 g. Men (over age 26): 28-34 g. Women (age 80 or younger): 25-28 g. Women (over age 70): 22-25 g. Your daily fiber goal is _____________ g. Shopping Choose whole fruits and vegetables instead of processed forms, such as apple juice or applesauce. Choose a wide variety of high-fiber foods such as avocados, lentils, oats, and kidney beans. Read the nutrition facts label of the foods you choose. Be aware of foods with added fiber. These foods often have high sugar and sodium amounts per serving. Cooking Use whole-grain flour for baking and cooking. Cook with brown rice instead of white rice. Meal planning Start the day with a breakfast that is high in fiber, such as a cereal that contains 5 g of fiber or more per serving. Eat breads and cereals that are made with whole-grain flour instead of refined flour or white flour. Eat brown rice, bulgur wheat,  or millet instead of white rice. Use beans in place of meat in soups, salads, and pasta dishes. Be sure that half of the grains you eat each day are whole grains. General information You can get the recommended daily intake of dietary fiber by: Eating a variety of fruits, vegetables, grains, nuts, and  beans. Taking a fiber supplement if you are not able to take in enough fiber in your diet. It is better to get fiber through food than from a supplement. Gradually increase how much fiber you consume. If you increase your intake of dietary fiber too quickly, you may have bloating, cramping, or gas. Drink plenty of water to help you digest fiber. Choose high-fiber snacks, such as berries, raw vegetables, nuts, and popcorn. What foods should I eat? Fruits Berries. Pears. Apples. Oranges. Avocado. Prunes and raisins. Dried figs. Vegetables Sweet potatoes. Spinach. Kale. Artichokes. Cabbage. Broccoli. Cauliflower. Green peas. Carrots. Squash. Grains Whole-grain breads. Multigrain cereal. Oats and oatmeal. Brown rice. Barley. Bulgur wheat. [place]. Quinoa. Bran muffins. Popcorn. Rye wafer crackers. Meats and other proteins Navy beans, kidney beans, and pinto beans. Soybeans. Split peas. Lentils. Nuts and seeds. Dairy Fiber-fortified yogurt. Beverages Fiber-fortified soy milk. Fiber-fortified orange juice. Other foods Fiber bars. The items listed above may not be a complete list of recommended foods and beverages. Contact a dietitian for more information. What foods should I avoid? Fruits Fruit juice. Cooked, strained fruit. Vegetables Fried potatoes. Canned vegetables. Well-cooked vegetables. Grains White bread. Pasta made with refined flour. White rice. Meats and other proteins Fatty cuts of meat. Fried chicken or fried fish. Dairy Milk. Yogurt. Cream cheese. Sour cream. Fats and oils Butters. Beverages Soft drinks. Other foods Cakes and pastries. The items listed above may not be a complete list of foods and beverages to avoid. Talk with your dietitian about what choices are best for you. Summary Fiber is a type of carbohydrate. It is found in foods such as fruits, vegetables, whole grains, and beans. A high-fiber diet has many benefits. It can help to prevent constipation, lower  blood cholesterol, aid weight loss, and reduce your risk of heart disease, diabetes, and certain cancers. Increase your intake of fiber gradually. Increasing fiber too quickly may cause cramping, bloating, and gas. Drink plenty of water while you increase the amount of fiber you consume. The best sources of fiber include whole fruits and vegetables, whole grains, nuts, seeds, and beans. This information is not intended to replace advice given to you by your health care provider. Make sure you discuss any questions you have with your health care provider. Document Revised: 01/08/2020 Document Reviewed: 01/08/2020 Elsevier Patient Education  2022 [employer].

## 2021-12-05 ENCOUNTER — Encounter: Payer: Self-pay | Admitting: Cardiology

## 2021-12-07 DIAGNOSIS — Z86010 Personal history of colonic polyps: Secondary | ICD-10-CM | POA: Diagnosis not present

## 2021-12-07 DIAGNOSIS — Z800 Family history of malignant neoplasm of digestive organs: Secondary | ICD-10-CM | POA: Diagnosis not present

## 2021-12-08 ENCOUNTER — Telehealth: Payer: Self-pay | Admitting: *Deleted

## 2021-12-08 NOTE — Telephone Encounter (Signed)
? ?  Pre-operative Risk Assessment  ?  ?Patient Name: Randy Hudson  ?DOB: 12-01-1945 ?MRN: [id number]  ? ?  ? ?Request for Surgical Clearance   ? ?Procedure:   COLONOSCOPY ? ?Date of Surgery:  Clearance 03/15/22                              ?   ?Surgeon:  DR. KARKI ?Surgeon's Group or Practice Name:  [facility] ?Phone number:  [phone] ?Fax number:  [phone] ?  ?Type of Clearance Requested:   ?- Medical  ?- Pharmacy:  Hold Clopidogrel (Plavix)   ?  ?Type of Anesthesia:   PROPOFOL ?  ?Additional requests/questions:   ? ?Signed, ?Julaine Hua   ?12/08/2021, 6:21 PM  ? ?

## 2021-12-09 NOTE — Telephone Encounter (Signed)
Preoperative team, Dr. Irish Lack has reviewed patient's request for colonoscopy.  Please contact patient for details surrounding colonoscopy.  (If it is a screening colonoscopy he will not be eligible for procedure until the end of July 2023) ? ?Per Dr. Irish Lack - " If this is an elective screening colonoscopy, would wait until 1 full year after PCI ( end of July 2023) given length of stent.  If there is some bleeding issues that makes this more acute, would proceed.  In either scenario, ok to hold plavix 5 days prior. " ? ? ?Thank you for your help, ? ?Jossie Ng. Hilary Milks NP-C ? ?  ?12/09/2021, 10:50 AM ?[place] ?[address] ?Office [phone] Fax [phone] ? ?  ?

## 2021-12-09 NOTE — Telephone Encounter (Signed)
Randy Hudson 76 year old male is requesting preoperative cardiac evaluation for colonoscopy.  He was last seen in the clinic on 11/10/2021.  He denied chest pain, dizziness, lower extremity edema, nitroglycerin use, palpitations, PND, and syncope.  He was walking regularly 2 times per week.  Follow-up was planned for 1 year. ? ?His PMH includes Had an abnormal coronary CTA.  Cardiac cath 03/2025 was done: ?"Prox RCA lesion is 40% stenosed. ?  Mid RCA lesion is 90% stenosed.  This is a relatively small vessel. ?  RPDA lesion is 100% stenosed.  Left-to-right collaterals feeding the PDA. ?  3rd Mrg lesion is 50% stenosed. ?  Prox LAD lesion is 40% stenosed. ?  Mid LAD lesion is 90% stenosed. ?  A drug-eluting stent was successfully placed using a STENT ONYX FRONTIER 3.0X26, postdilated to 3.5 mm. ?  Post intervention, there is a 0% residual stenosis. ?  Dist LAD lesion is 40% stenosed. ?  The left ventricular systolic function is normal. ?  The left ventricular ejection fraction is 55-65% by visual estimate. ?  There is no aortic valve stenosis. ?  ?Significant tortuosity noted in the aorta leading to the ascending aorta may catheter torquing difficult.  Consider destination sheath if repeat cath performed from right radial.  The tortuosity made guide support difficult as well.   ?  ?Plan for medical therapy of RCA given PDA is occluded with collaterals. ? ?Would plan for clopidogrel monotherapy after his course of dual antiplatelet therapy given the diffuse disease in the other vessels." ? ?Hyperlipidemia, and hypertension. ? ?May his Plavix be held prior to his procedure? ? ?Thank you for your help.  Please direct your response to CV DIV preop pool. ? ?Jossie Ng. Rawley Harju NP-C ? ?  ?12/09/2021, 7:38 AM ?[place] ?[address] ?Office [phone] Fax [phone] ? ? ?

## 2021-12-09 NOTE — Telephone Encounter (Signed)
If this is an elective screening colonoscopy, would wait until 1 full year after PCI ( end of July 2023) given length of stent.  If there is some bleeding issues that makes this more acute, would proceed.  In either scenario, ok to hold plavix 5 days prior.  ? ? ?

## 2021-12-09 NOTE — Telephone Encounter (Signed)
I tried to reach out to requesting office to discuss further about upcoming procedure , though no-one answered the phone.  ? ?I will fax notes to requesting office to please see the notes and recommendations.  ? ?Preference per Dr. Irish Lack is that if the procedure is not of urgent matter to hold off on procedure until the end of July 2023.  ?

## 2022-01-03 DIAGNOSIS — H903 Sensorineural hearing loss, bilateral: Secondary | ICD-10-CM | POA: Diagnosis not present

## 2022-01-03 DIAGNOSIS — Z77122 Contact with and (suspected) exposure to noise: Secondary | ICD-10-CM | POA: Diagnosis not present

## 2022-01-03 DIAGNOSIS — Z822 Family history of deafness and hearing loss: Secondary | ICD-10-CM | POA: Diagnosis not present

## 2022-01-05 DIAGNOSIS — L821 Other seborrheic keratosis: Secondary | ICD-10-CM | POA: Diagnosis not present

## 2022-01-05 DIAGNOSIS — D485 Neoplasm of uncertain behavior of skin: Secondary | ICD-10-CM | POA: Diagnosis not present

## 2022-01-05 DIAGNOSIS — C4441 Basal cell carcinoma of skin of scalp and neck: Secondary | ICD-10-CM | POA: Diagnosis not present

## 2022-01-10 DIAGNOSIS — H9113 Presbycusis, bilateral: Secondary | ICD-10-CM | POA: Diagnosis not present

## 2022-01-10 DIAGNOSIS — Z955 Presence of coronary angioplasty implant and graft: Secondary | ICD-10-CM | POA: Diagnosis not present

## 2022-01-10 DIAGNOSIS — I700 Atherosclerosis of aorta: Secondary | ICD-10-CM | POA: Diagnosis not present

## 2022-01-10 DIAGNOSIS — I2510 Atherosclerotic heart disease of native coronary artery without angina pectoris: Secondary | ICD-10-CM | POA: Diagnosis not present

## 2022-01-10 DIAGNOSIS — E663 Overweight: Secondary | ICD-10-CM | POA: Diagnosis not present

## 2022-01-10 DIAGNOSIS — E7800 Pure hypercholesterolemia, unspecified: Secondary | ICD-10-CM | POA: Diagnosis not present

## 2022-01-10 DIAGNOSIS — I10 Essential (primary) hypertension: Secondary | ICD-10-CM | POA: Diagnosis not present

## 2022-01-11 DIAGNOSIS — H353111 Nonexudative age-related macular degeneration, right eye, early dry stage: Secondary | ICD-10-CM | POA: Diagnosis not present

## 2022-01-11 DIAGNOSIS — H353122 Nonexudative age-related macular degeneration, left eye, intermediate dry stage: Secondary | ICD-10-CM | POA: Diagnosis not present

## 2022-01-11 DIAGNOSIS — H34831 Tributary (branch) retinal vein occlusion, right eye, with macular edema: Secondary | ICD-10-CM | POA: Diagnosis not present

## 2022-01-11 DIAGNOSIS — H33193 Other retinoschisis and retinal cysts, bilateral: Secondary | ICD-10-CM | POA: Diagnosis not present

## 2022-01-17 ENCOUNTER — Telehealth (INDEPENDENT_AMBULATORY_CARE_PROVIDER_SITE_OTHER): Payer: Medicare Other | Admitting: Cardiology

## 2022-01-17 ENCOUNTER — Encounter: Payer: Self-pay | Admitting: Cardiology

## 2022-01-17 VITALS — BP 125/70 | HR 73 | Ht 65.0 in | Wt 174.0 lb

## 2022-01-17 DIAGNOSIS — I25118 Atherosclerotic heart disease of native coronary artery with other forms of angina pectoris: Secondary | ICD-10-CM

## 2022-01-17 DIAGNOSIS — G4733 Obstructive sleep apnea (adult) (pediatric): Secondary | ICD-10-CM | POA: Diagnosis not present

## 2022-01-17 DIAGNOSIS — I10 Essential (primary) hypertension: Secondary | ICD-10-CM

## 2022-01-17 NOTE — Progress Notes (Signed)
? ?Virtual Visit via Video Note  ? ?This visit type was conducted due to national recommendations for restrictions regarding the COVID-19 Pandemic (e.g. social distancing) in an effort to limit this patient's exposure and mitigate transmission in our community.  Due to his co-morbid illnesses, this patient is at least at moderate risk for complications without adequate follow up.  This format is felt to be most appropriate for this patient at this time.  All issues noted in this document were discussed and addressed.  A limited physical exam was performed with this format.  Please refer to the patient's chart for his consent to telehealth for [facility]. ? ?   ? ? ?Date:  01/17/2022  ? ?ID:  Randy Hudson, DOB 1945/12/26, MRN [id number] ?The patient was identified using 2 identifiers. ? ?Patient Location: Home ?Provider Location: Home Office ? ? ?PCP:  Kathyrn Lass, MD ?  ?[facility] Providers ?Cardiologist:  [name], MD    ? ?Evaluation Performed:  Follow-Up Visit ? ?Chief Complaint: OSA ? ?History of Present Illness:   ? ?Randy Hudson is a 76 y.o. male with a history of ASCHD, hypertension and hyperlipidemia who was referred for sleep study.  He says that he told Dr. Irish Lack that he thought he might had OSA.  His wife would notice that he stops breathing during sleep and he snores a lot.  He also was having nonrestorative sleep and excessive daytime sleepiness.  He underwent PSG on 06/15/2021 showing severe obstructive sleep apnea with an AHI of 55.2/h and no significant central events.  O2 saturations dropped as low as 86%.  He underwent CPAP titration but due to ongoing events was placed on auto BiPAP with IPAP max 20 cm H2O, EPAP min 5 cm H2O and pressure support 4 cm H2O.  He is now here for follow-up. ? ?He is doing well with his BiPAP device and thinks that he has gotten used to it.  He tolerates the mask and feels the pressure is adequate.  Since going on BiPAP he feels rested in the am  and has no significant daytime sleepiness.  He feels so much better with BiPAP.  He used to wake up frequently at night and that has stopped.  He no longer naps during the day. He denies any significant mouth or nasal dryness or nasal congestion.  He does not think that he snores.    ? ?The patient does not have symptoms concerning for COVID-19 infection (fever, chills, cough, or new shortness of breath).  ? ? ?Past Medical History:  ?Diagnosis Date  ? Hyperlipidemia   ? Hypertension   ? ?Past Surgical History:  ?Procedure Laterality Date  ? CORONARY STENT INTERVENTION N/A 04/12/2021  ? Procedure: CORONARY STENT INTERVENTION;  Surgeon: Jettie Booze, MD;  Location: [place] [facility];  Service: Cardiovascular;  Laterality: N/A;  ? LEFT HEART CATH AND CORONARY ANGIOGRAPHY N/A 04/12/2021  ? Procedure: LEFT HEART CATH AND CORONARY ANGIOGRAPHY;  Surgeon: Jettie Booze, MD;  Location: [place] [facility];  Service: Cardiovascular;  Laterality: N/A;  ?  ? ?Current Meds  ?Medication Sig  ? Aflibercept (EYLEA IO) Inject 1 Dose into the eye every 3 (three) months.  ? atorvastatin (LIPITOR) 80 MG tablet Take 80 mg by mouth in the morning.  ? carboxymethylcellul-glycerin (LUBRICANT DROPS/DUAL-ACTION) 0.5-0.9 % ophthalmic solution Place 1 drop into both eyes 3 (three) times daily as needed (dry/irritated eyes.).  ? Cholecalciferol (VITAMIN D) 50 MCG (2000 UT) tablet Take  2,000 Units by mouth in the morning.  ? clopidogrel (PLAVIX) 75 MG tablet Take 1 tablet (75 mg total) by mouth daily with breakfast.  ? inclisiran (LEQVIO) 284 MG/1.5ML SOSY injection Inject 284 mg into the skin as directed. Administered subcutaneously every 6 months  ? metoprolol succinate (TOPROL-XL) 25 MG 24 hr tablet TAKE 1 TABLET BY MOUTH DAILY. TAKE WITH OR IMMEDIATELY FOLLOWING A MEAL.  ? nitroGLYCERIN (NITROSTAT) 0.4 MG SL tablet Place 1 tablet (0.4 mg total) under the tongue every 5 (five) minutes as needed for chest pain.  ? tobramycin  (TOBREX) 0.3 % ophthalmic solution Place 1 drop into the right eye as directed. Instill 1 drop into right eye 4 times daily the day before, the day of & the day following eylea intravitreal injection  ? triamterene-hydrochlorothiazide (DYAZIDE) 37.5-25 MG capsule Take 1 capsule by mouth every morning.  ?  ? ?Allergies:   Patient has no known allergies.  ? ?Social History  ? ?Tobacco Use  ? Smoking status: Never  ? Smokeless tobacco: Never  ?  ? ?Family Hx: ?The patient's family history includes Colon cancer in his father; Heart disease in his father. ? ?ROS:   ?Please see the history of present illness.    ? ?All other systems reviewed and are negative. ? ? ?Prior CV studies:   ?The following studies were reviewed today: ? ?PSG and CPAP titration, PAP download compliance ? ?Labs/Other Tests and Data Reviewed:   ? ?EKG:  No ECG reviewed. ? ?Recent Labs: ?04/13/2021: BUN 26; Creatinine, Ser 1.05; Hemoglobin 14.2; Platelets 279; Potassium 4.6; Sodium 137  ? ?Recent Lipid Panel ?No results found for: CHOL, TRIG, HDL, CHOLHDL, LDLCALC, LDLDIRECT ? ?Wt Readings from Last 3 Encounters:  ?01/17/22 174 lb (78.9 kg)  ?11/10/21 174 lb (78.9 kg)  ?10/07/21 170 lb (77.1 kg)  ?  ? ?Risk Assessment/Calculations:   ?  ? ?STOP-Bang Score:  6      ?Objective:   ? ?Vital Signs:  BP 125/70   Pulse 73   Ht '5\' 5"'$  (1.651 m)   Wt 174 lb (78.9 kg)   BMI 28.96 kg/m?   ? ?VITAL SIGNS:  reviewed ?GEN:  no acute distress ?EYES:  sclerae anicteric, EOMI - Extraocular Movements Intact ?RESPIRATORY:  normal respiratory effort, symmetric expansion ?CARDIOVASCULAR:  no peripheral edema ?SKIN:  no rash, lesions or ulcers. ?MUSCULOSKELETAL:  no obvious deformities. ?NEURO:  alert and oriented x 3, no obvious focal deficit ?PSYCH:  normal affect ? ?ASSESSMENT & PLAN:   ? ?OSA - The patient is tolerating PAP therapy well without any problems. The PAP download performed by his DME was personally reviewed and interpreted by me today and showed an AHI  of 9.3 /hr on auto BiPAP with 100% compliance in using more than 4 hours nightly.  The patient has been using and benefiting from PAP use and will continue to benefit from therapy.  ?-AHI is significantly improved but is still greater than 5.  In the past few nights his AHI has been around 3.6/hr.  ?-he does not sleep on his back ?-He has responded beautifully with the BiPAP and is doing well.   ? ?2.  HTN ?-BP has been controlled at home ?-Continue prescription drug management with Toprol-XL 25 mg daily and Dyazide 37.5/25 mg daily with as needed refills ? ? ?COVID-19 Education: ?The signs and symptoms of COVID-19 were discussed with the patient and how to seek care for testing (follow up with PCP or arrange E-visit).  The importance of social distancing was discussed today. ? ?Time:   ?Today, I have spent 15 minutes with the patient with telehealth technology discussing the above problems.   ? ? ?Medication Adjustments/Labs and Tests Ordered: ?Current medicines are reviewed at length with the patient today.  Concerns regarding medicines are outlined above.  ? ?Tests Ordered: ?No orders of the defined types were placed in this encounter. ? ? ?Medication Changes: ?No orders of the defined types were placed in this encounter. ? ? ?Follow Up:  Virtual Visit  in 1 year(s) ? ?Signed, ?Fransico Him, MD  ?01/17/2022 9:10 AM    ?[place] ?

## 2022-01-17 NOTE — Patient Instructions (Signed)

## 2022-01-24 DIAGNOSIS — Z0000 Encounter for general adult medical examination without abnormal findings: Secondary | ICD-10-CM | POA: Diagnosis not present

## 2022-01-24 DIAGNOSIS — Z23 Encounter for immunization: Secondary | ICD-10-CM | POA: Diagnosis not present

## 2022-01-24 DIAGNOSIS — Z1389 Encounter for screening for other disorder: Secondary | ICD-10-CM | POA: Diagnosis not present

## 2022-01-26 DIAGNOSIS — C4441 Basal cell carcinoma of skin of scalp and neck: Secondary | ICD-10-CM | POA: Diagnosis not present

## 2022-03-13 ENCOUNTER — Inpatient Hospital Stay (HOSPITAL_COMMUNITY): Admission: RE | Admit: 2022-03-13 | Payer: Medicare Other | Source: Ambulatory Visit

## 2022-03-27 ENCOUNTER — Encounter (HOSPITAL_COMMUNITY)
Admission: RE | Admit: 2022-03-27 | Discharge: 2022-03-27 | Disposition: A | Discharge status: Home / Self Care | Payer: Medicare Other | Source: Ambulatory Visit | Attending: Interventional Cardiology | Admitting: Interventional Cardiology

## 2022-03-27 DIAGNOSIS — E785 Hyperlipidemia, unspecified: Secondary | ICD-10-CM | POA: Diagnosis not present

## 2022-03-27 MED ORDER — INCLISIRAN SODIUM 284 MG/1.5ML ~~LOC~~ SOSY
284.0000 mg | PREFILLED_SYRINGE | Freq: Once | SUBCUTANEOUS | Status: AC
  Administered 2022-03-27: 284 mg via SUBCUTANEOUS

## 2022-03-27 MED ORDER — INCLISIRAN SODIUM 284 MG/1.5ML ~~LOC~~ SOSY
PREFILLED_SYRINGE | SUBCUTANEOUS | Status: AC
  Filled 2022-03-27: qty 1.5

## 2022-03-28 DIAGNOSIS — H35033 Hypertensive retinopathy, bilateral: Secondary | ICD-10-CM | POA: Diagnosis not present

## 2022-03-30 ENCOUNTER — Other Ambulatory Visit: Payer: Self-pay | Admitting: Cardiology

## 2022-04-14 DIAGNOSIS — D123 Benign neoplasm of transverse colon: Secondary | ICD-10-CM | POA: Diagnosis not present

## 2022-04-14 DIAGNOSIS — D122 Benign neoplasm of ascending colon: Secondary | ICD-10-CM | POA: Diagnosis not present

## 2022-04-14 DIAGNOSIS — K648 Other hemorrhoids: Secondary | ICD-10-CM | POA: Diagnosis not present

## 2022-04-14 DIAGNOSIS — D120 Benign neoplasm of cecum: Secondary | ICD-10-CM | POA: Diagnosis not present

## 2022-04-14 DIAGNOSIS — Z800 Family history of malignant neoplasm of digestive organs: Secondary | ICD-10-CM | POA: Diagnosis not present

## 2022-04-14 DIAGNOSIS — Z86010 Personal history of colonic polyps: Secondary | ICD-10-CM | POA: Diagnosis not present

## 2022-04-14 DIAGNOSIS — Z09 Encounter for follow-up examination after completed treatment for conditions other than malignant neoplasm: Secondary | ICD-10-CM | POA: Diagnosis not present

## 2022-04-14 DIAGNOSIS — D124 Benign neoplasm of descending colon: Secondary | ICD-10-CM | POA: Diagnosis not present

## 2022-04-18 DIAGNOSIS — D124 Benign neoplasm of descending colon: Secondary | ICD-10-CM | POA: Diagnosis not present

## 2022-04-25 DIAGNOSIS — H33193 Other retinoschisis and retinal cysts, bilateral: Secondary | ICD-10-CM | POA: Diagnosis not present

## 2022-04-25 DIAGNOSIS — H353132 Nonexudative age-related macular degeneration, bilateral, intermediate dry stage: Secondary | ICD-10-CM | POA: Diagnosis not present

## 2022-04-25 DIAGNOSIS — H348312 Tributary (branch) retinal vein occlusion, right eye, stable: Secondary | ICD-10-CM | POA: Diagnosis not present

## 2022-04-25 DIAGNOSIS — H43813 Vitreous degeneration, bilateral: Secondary | ICD-10-CM | POA: Diagnosis not present

## 2022-06-06 DIAGNOSIS — H353132 Nonexudative age-related macular degeneration, bilateral, intermediate dry stage: Secondary | ICD-10-CM | POA: Diagnosis not present

## 2022-06-06 DIAGNOSIS — H33193 Other retinoschisis and retinal cysts, bilateral: Secondary | ICD-10-CM | POA: Diagnosis not present

## 2022-06-06 DIAGNOSIS — H43813 Vitreous degeneration, bilateral: Secondary | ICD-10-CM | POA: Diagnosis not present

## 2022-06-06 DIAGNOSIS — H34831 Tributary (branch) retinal vein occlusion, right eye, with macular edema: Secondary | ICD-10-CM | POA: Diagnosis not present

## 2022-06-25 ENCOUNTER — Other Ambulatory Visit: Payer: Self-pay | Admitting: Interventional Cardiology

## 2022-06-28 DIAGNOSIS — D0421 Carcinoma in situ of skin of right ear and external auricular canal: Secondary | ICD-10-CM | POA: Diagnosis not present

## 2022-06-28 DIAGNOSIS — D0439 Carcinoma in situ of skin of other parts of face: Secondary | ICD-10-CM | POA: Diagnosis not present

## 2022-06-28 DIAGNOSIS — L570 Actinic keratosis: Secondary | ICD-10-CM | POA: Diagnosis not present

## 2022-06-28 DIAGNOSIS — D485 Neoplasm of uncertain behavior of skin: Secondary | ICD-10-CM | POA: Diagnosis not present

## 2022-06-28 DIAGNOSIS — L820 Inflamed seborrheic keratosis: Secondary | ICD-10-CM | POA: Diagnosis not present

## 2022-07-12 DIAGNOSIS — Z125 Encounter for screening for malignant neoplasm of prostate: Secondary | ICD-10-CM | POA: Diagnosis not present

## 2022-07-12 DIAGNOSIS — Z23 Encounter for immunization: Secondary | ICD-10-CM | POA: Diagnosis not present

## 2022-07-12 DIAGNOSIS — E7800 Pure hypercholesterolemia, unspecified: Secondary | ICD-10-CM | POA: Diagnosis not present

## 2022-07-12 DIAGNOSIS — Z6829 Body mass index (BMI) 29.0-29.9, adult: Secondary | ICD-10-CM | POA: Diagnosis not present

## 2022-07-12 DIAGNOSIS — I10 Essential (primary) hypertension: Secondary | ICD-10-CM | POA: Diagnosis not present

## 2022-07-12 DIAGNOSIS — E663 Overweight: Secondary | ICD-10-CM | POA: Diagnosis not present

## 2022-07-12 DIAGNOSIS — I2510 Atherosclerotic heart disease of native coronary artery without angina pectoris: Secondary | ICD-10-CM | POA: Diagnosis not present

## 2022-07-18 DIAGNOSIS — D0421 Carcinoma in situ of skin of right ear and external auricular canal: Secondary | ICD-10-CM | POA: Diagnosis not present

## 2022-08-01 DIAGNOSIS — H34831 Tributary (branch) retinal vein occlusion, right eye, with macular edema: Secondary | ICD-10-CM | POA: Diagnosis not present

## 2022-08-15 DIAGNOSIS — Z23 Encounter for immunization: Secondary | ICD-10-CM | POA: Diagnosis not present

## 2022-08-28 ENCOUNTER — Encounter (HOSPITAL_COMMUNITY): Payer: Medicare Other

## 2022-09-22 ENCOUNTER — Other Ambulatory Visit: Payer: Self-pay | Admitting: Pharmacist

## 2022-09-22 DIAGNOSIS — I25118 Atherosclerotic heart disease of native coronary artery with other forms of angina pectoris: Secondary | ICD-10-CM

## 2022-09-25 ENCOUNTER — Encounter (HOSPITAL_COMMUNITY)
Admission: RE | Admit: 2022-09-25 | Discharge: 2022-09-25 | Disposition: A | Discharge status: Home / Self Care | Payer: Medicare Other | Source: Ambulatory Visit | Attending: Interventional Cardiology | Admitting: Interventional Cardiology

## 2022-09-25 DIAGNOSIS — I25118 Atherosclerotic heart disease of native coronary artery with other forms of angina pectoris: Secondary | ICD-10-CM | POA: Diagnosis not present

## 2022-09-25 MED ORDER — INCLISIRAN SODIUM 284 MG/1.5ML ~~LOC~~ SOSY
PREFILLED_SYRINGE | SUBCUTANEOUS | Status: AC
  Filled 2022-09-25: qty 1.5

## 2022-09-25 MED ORDER — INCLISIRAN SODIUM 284 MG/1.5ML ~~LOC~~ SOSY
284.0000 mg | PREFILLED_SYRINGE | Freq: Once | SUBCUTANEOUS | Status: AC
  Administered 2022-09-25: 284 mg via SUBCUTANEOUS

## 2022-09-26 DIAGNOSIS — H34831 Tributary (branch) retinal vein occlusion, right eye, with macular edema: Secondary | ICD-10-CM | POA: Diagnosis not present

## 2022-09-26 DIAGNOSIS — H353132 Nonexudative age-related macular degeneration, bilateral, intermediate dry stage: Secondary | ICD-10-CM | POA: Diagnosis not present

## 2022-09-26 DIAGNOSIS — H43813 Vitreous degeneration, bilateral: Secondary | ICD-10-CM | POA: Diagnosis not present

## 2022-10-19 ENCOUNTER — Other Ambulatory Visit: Payer: Self-pay | Admitting: Interventional Cardiology

## 2022-11-21 DIAGNOSIS — H43813 Vitreous degeneration, bilateral: Secondary | ICD-10-CM | POA: Diagnosis not present

## 2022-11-21 DIAGNOSIS — H34831 Tributary (branch) retinal vein occlusion, right eye, with macular edema: Secondary | ICD-10-CM | POA: Diagnosis not present

## 2022-11-21 DIAGNOSIS — H353132 Nonexudative age-related macular degeneration, bilateral, intermediate dry stage: Secondary | ICD-10-CM | POA: Diagnosis not present

## 2022-12-22 DIAGNOSIS — L821 Other seborrheic keratosis: Secondary | ICD-10-CM | POA: Diagnosis not present

## 2022-12-22 DIAGNOSIS — D099 Carcinoma in situ, unspecified: Secondary | ICD-10-CM | POA: Diagnosis not present

## 2022-12-22 DIAGNOSIS — L570 Actinic keratosis: Secondary | ICD-10-CM | POA: Diagnosis not present

## 2023-01-09 DIAGNOSIS — I2510 Atherosclerotic heart disease of native coronary artery without angina pectoris: Secondary | ICD-10-CM | POA: Diagnosis not present

## 2023-01-09 DIAGNOSIS — E663 Overweight: Secondary | ICD-10-CM | POA: Diagnosis not present

## 2023-01-09 DIAGNOSIS — I10 Essential (primary) hypertension: Secondary | ICD-10-CM | POA: Diagnosis not present

## 2023-01-09 DIAGNOSIS — E7800 Pure hypercholesterolemia, unspecified: Secondary | ICD-10-CM | POA: Diagnosis not present

## 2023-01-09 DIAGNOSIS — Z6829 Body mass index (BMI) 29.0-29.9, adult: Secondary | ICD-10-CM | POA: Diagnosis not present

## 2023-01-09 DIAGNOSIS — I700 Atherosclerosis of aorta: Secondary | ICD-10-CM | POA: Diagnosis not present

## 2023-01-09 DIAGNOSIS — Z955 Presence of coronary angioplasty implant and graft: Secondary | ICD-10-CM | POA: Diagnosis not present

## 2023-01-18 DIAGNOSIS — L570 Actinic keratosis: Secondary | ICD-10-CM | POA: Diagnosis not present

## 2023-01-18 DIAGNOSIS — Z85828 Personal history of other malignant neoplasm of skin: Secondary | ICD-10-CM | POA: Diagnosis not present

## 2023-01-18 DIAGNOSIS — D225 Melanocytic nevi of trunk: Secondary | ICD-10-CM | POA: Diagnosis not present

## 2023-01-18 DIAGNOSIS — D485 Neoplasm of uncertain behavior of skin: Secondary | ICD-10-CM | POA: Diagnosis not present

## 2023-01-18 DIAGNOSIS — L821 Other seborrheic keratosis: Secondary | ICD-10-CM | POA: Diagnosis not present

## 2023-01-18 DIAGNOSIS — D239 Other benign neoplasm of skin, unspecified: Secondary | ICD-10-CM | POA: Diagnosis not present

## 2023-01-18 DIAGNOSIS — C44319 Basal cell carcinoma of skin of other parts of face: Secondary | ICD-10-CM | POA: Diagnosis not present

## 2023-01-18 DIAGNOSIS — C44219 Basal cell carcinoma of skin of left ear and external auricular canal: Secondary | ICD-10-CM | POA: Diagnosis not present

## 2023-01-18 DIAGNOSIS — L723 Sebaceous cyst: Secondary | ICD-10-CM | POA: Diagnosis not present

## 2023-01-18 DIAGNOSIS — Z86018 Personal history of other benign neoplasm: Secondary | ICD-10-CM | POA: Diagnosis not present

## 2023-01-26 DIAGNOSIS — E663 Overweight: Secondary | ICD-10-CM | POA: Diagnosis not present

## 2023-01-26 DIAGNOSIS — Z1389 Encounter for screening for other disorder: Secondary | ICD-10-CM | POA: Diagnosis not present

## 2023-01-26 DIAGNOSIS — Z0000 Encounter for general adult medical examination without abnormal findings: Secondary | ICD-10-CM | POA: Diagnosis not present

## 2023-02-04 NOTE — Progress Notes (Unsigned)
Cardiology Office Note   Date:  02/05/2023   ID:  Randy Hudson, DOB Apr 03, 1946, MRN [id number]  PCP:  Sigmund Hazel, MD    No chief complaint on file.  CAD  Wt Readings from Last 3 Encounters:  02/05/23 169 lb 6.4 oz (76.8 kg)  01/17/22 174 lb (78.9 kg)  11/10/21 174 lb (78.9 kg)       History of Present Illness: Randy Hudson is a 77 y.o. male  with family h/o CAD.   Son had CABG in 2022 at [facility]. Father had AVR.  Older brother had CABG at age 40. THey were all smokers.   Patient does not smoke.   Had an abnormal coronary CTA.  Cardiac cath 03/2021 was done: "Prox RCA lesion is 40% stenosed.   Mid RCA lesion is 90% stenosed.  This is a relatively small vessel.   RPDA lesion is 100% stenosed.  Left-to-right collaterals feeding the PDA.   3rd Mrg lesion is 50% stenosed.   Prox LAD lesion is 40% stenosed.   Mid LAD lesion is 90% stenosed.   A drug-eluting stent was successfully placed using a STENT ONYX FRONTIER 3.0X26, postdilated to 3.5 mm.   Post intervention, there is a 0% residual stenosis.   Dist LAD lesion is 40% stenosed.   The left ventricular systolic function is normal.   The left ventricular ejection fraction is 55-65% by visual estimate.   There is no aortic valve stenosis.   Significant tortuosity noted in the aorta leading to the ascending aorta may catheter torquing difficult.  Consider destination sheath if repeat cath performed from right radial.  The tortuosity made guide support difficult as well.     Plan for medical therapy of RCA given PDA is occluded with collaterals.  Would plan for clopidogrel monotherapy after his course of dual antiplatelet therapy given the diffuse disease in the other vessels."  Denies : Chest pain. Dizziness. Leg edema. Nitroglycerin use. Orthopnea. Palpitations. Paroxysmal nocturnal dyspnea. Shortness of breath. Syncope.    Walks some.  Does yardwork.     Past Medical History:  Diagnosis Date   Hyperlipidemia     Hypertension     Past Surgical History:  Procedure Laterality Date   CORONARY STENT INTERVENTION N/A 04/12/2021   Procedure: CORONARY STENT INTERVENTION;  Surgeon: Corky Crafts, MD;  Location: [facility];  Service: Cardiovascular;  Laterality: N/A;   LEFT HEART CATH AND CORONARY ANGIOGRAPHY N/A 04/12/2021   Procedure: LEFT HEART CATH AND CORONARY ANGIOGRAPHY;  Surgeon: Corky Crafts, MD;  [facility]: [facility];  Service: Cardiovascular;  Laterality: N/A;     Current Outpatient Medications  Medication Sig Dispense Refill   Aflibercept (EYLEA IO) Inject 1 Dose into the eye every 3 (three) months.     atorvastatin (LIPITOR) 80 MG tablet Take 80 mg by mouth in the morning.     carboxymethylcellul-glycerin (LUBRICANT DROPS/DUAL-ACTION) 0.5-0.9 % ophthalmic solution Place 1 drop into both eyes 3 (three) times daily as needed (dry/irritated eyes.).     Cholecalciferol (VITAMIN D) 50 MCG (2000 UT) tablet Take 2,000 Units by mouth in the morning.     clopidogrel (PLAVIX) 75 MG tablet Take 1 tablet (75 mg total) by mouth daily. 90 tablet 2   inclisiran (LEQVIO) 284 MG/1.5ML SOSY injection Inject 284 mg into the skin as directed. Administered subcutaneously every 6 months     metoprolol succinate (TOPROL-XL) 25 MG 24 hr tablet TAKE 1 TABLET BY MOUTH EVERY  DAY WITH OR IMMEDIATELY FOLLOWING A MEAL 90 tablet 2   Multiple Vitamins-Minerals (PRESERVISION AREDS 2+MULTI VIT PO) Take 1 capsule by mouth 2 (two) times daily.     nitroGLYCERIN (NITROSTAT) 0.4 MG SL tablet Place 1 tablet (0.4 mg total) under the tongue every 5 (five) minutes as needed for chest pain. 25 tablet 6   tobramycin (TOBREX) 0.3 % ophthalmic solution Place 1 drop into the right eye as directed. Instill 1 drop into right eye 4 times daily the day before, the day of & the day following eylea intravitreal injection     triamterene-hydrochlorothiazide (DYAZIDE) 37.5-25 MG capsule Take 1 capsule by mouth every  morning.     No current facility-administered medications for this visit.    Allergies:   Patient has no known allergies.    Social History:  The patient  reports that he has never smoked. He has never used smokeless tobacco.   Family History:  The patient's family history includes Colon cancer in his father; Heart disease in his father.    ROS:  Please see the history of present illness.   Otherwise, review of systems are positive for difficulty losing weight.   All other systems are reviewed and negative.    PHYSICAL EXAM: VS:  BP (!) 146/76   Pulse 67   Ht 5\' 5"  (1.651 m)   Wt 169 lb 6.4 oz (76.8 kg)   SpO2 96%   BMI 28.19 kg/m  , BMI Body mass index is 28.19 kg/m. GEN: Well nourished, well developed, in no acute distress HEENT: normal Neck: no JVD, carotid bruits, or masses Cardiac: RRR; no murmurs, rubs, or gallops,no edema  Respiratory:  clear to auscultation bilaterally, normal work of breathing GI: soft, nontender, nondistended, + BS MS: no deformity or atrophy Skin: warm and dry, no rash Neuro:  Strength and sensation are intact Psych: euthymic mood, full affect   EKG:   The ekg ordered today demonstrates NSR, inferior T wave inversion    Recent Labs: No results found for requested labs within last 365 days.   Lipid Panel No results found for: "CHOL", "TRIG", "HDL", "CHOLHDL", "VLDL", "LDLCALC", "LDLDIRECT"   Other studies Reviewed: Additional studies/ records that were reviewed today with results demonstrating: labs reviewed; October 2023 LDL 35 hemoglobin 15.7 ALT 28 ; TSH 4.3 in 2022.   ASSESSMENT AND PLAN:  CAD: Status post LAD PCI.  CTO RCA known from 2022.   No angina.   Refill nitroglycerin. HTN:  Home readings in the 120-130 systolic.  Avoid excessive salt.  Avoid processed foods. Hyperlipidemia: Tolerating Leqvio.  LDL 35.  Whole food, plant-based diet.  High-fiber diet.  He avoids red meat.  He does eat a lot of vegetables. OSA:  Started  BiPap.  Feels better with the BiPap. Preoperative cardiovascular exam: No further cardiac testing needed before Mohs surgery.  Okay to hold Plavix 5 days prior to surgery.  Consider resuming Plavix 2 days after surgery, especially given bleeding issues that happened around the time of the skin biopsy. Overwieght: Has had trouble losing weight.  We talked about adding some resistance training to help with weight loss.  Brisk walking on a regular basis will also be helpful.   Current medicines are reviewed at length with the patient today.  The patient concerns regarding his medicines were addressed.  The following changes have been made:  No change  Labs/ tests ordered today include:  No orders of the defined types were placed in this encounter.  Recommend 150 minutes/week of aerobic exercise Low fat, low carb, high fiber diet recommended  Disposition:   FU in 1 year   Signed, Lance Muss, MD  02/05/2023 9:10 AM    [facility] [address] [place], [place], [place]  [address] Phone: [phone]; Fax: [phone]

## 2023-02-05 ENCOUNTER — Encounter: Payer: Self-pay | Admitting: Interventional Cardiology

## 2023-02-05 ENCOUNTER — Ambulatory Visit: Payer: Medicare Other | Attending: Interventional Cardiology | Admitting: Interventional Cardiology

## 2023-02-05 VITALS — BP 146/76 | HR 67 | Ht 65.0 in | Wt 169.4 lb

## 2023-02-05 DIAGNOSIS — E663 Overweight: Secondary | ICD-10-CM | POA: Diagnosis not present

## 2023-02-05 DIAGNOSIS — E782 Mixed hyperlipidemia: Secondary | ICD-10-CM

## 2023-02-05 DIAGNOSIS — G4733 Obstructive sleep apnea (adult) (pediatric): Secondary | ICD-10-CM

## 2023-02-05 DIAGNOSIS — I25118 Atherosclerotic heart disease of native coronary artery with other forms of angina pectoris: Secondary | ICD-10-CM | POA: Diagnosis not present

## 2023-02-05 DIAGNOSIS — I10 Essential (primary) hypertension: Secondary | ICD-10-CM | POA: Diagnosis not present

## 2023-02-05 MED ORDER — NITROGLYCERIN 0.4 MG SL SUBL: 0.4000 mg | SUBLINGUAL_TABLET | SUBLINGUAL | 6 refills | Status: AC | PRN

## 2023-02-05 NOTE — Patient Instructions (Signed)
Medication Instructions:  Your physician recommends that you continue on your current medications as directed. Please refer to the Current Medication list given to you today.  *If you need a refill on your cardiac medications before your next appointment, please call your pharmacy*   Lab Work: none If you have labs (blood work) drawn today and your tests are completely normal, you will receive your results only by: MyChart Message (if you have MyChart) OR A paper copy in the mail If you have any lab test that is abnormal or we need to change your treatment, we will call you to review the results.   Testing/Procedures: none   Follow-Up: At [facility], you and your health needs are our priority.  As part of our continuing mission to provide you with exceptional heart care, we have created designated Provider Care Teams.  These Care Teams include your primary Cardiologist (physician) and Advanced Practice Providers (APPs -  Physician Assistants and Nurse Practitioners) who all work together to provide you with the care you need, when you need it.  We recommend signing up for the patient portal called "MyChart".  Sign up information is provided on this After Visit Summary.  MyChart is used to connect with patients for Virtual Visits (Telemedicine).  Patients are able to view lab/test results, encounter notes, upcoming appointments, etc.  Non-urgent messages can be sent to your provider as well.   To learn more about what you can do with MyChart, go to [email or web address].    Your next appointment:   12 month(s)  Provider:   Lance Muss, MD     Other Instructions  Please schedule appointment with Dr Mayford Knife for sleep follow up

## 2023-02-14 DIAGNOSIS — H43813 Vitreous degeneration, bilateral: Secondary | ICD-10-CM | POA: Diagnosis not present

## 2023-02-14 DIAGNOSIS — H353132 Nonexudative age-related macular degeneration, bilateral, intermediate dry stage: Secondary | ICD-10-CM | POA: Diagnosis not present

## 2023-02-14 DIAGNOSIS — H34831 Tributary (branch) retinal vein occlusion, right eye, with macular edema: Secondary | ICD-10-CM | POA: Diagnosis not present

## 2023-02-14 DIAGNOSIS — H2513 Age-related nuclear cataract, bilateral: Secondary | ICD-10-CM | POA: Diagnosis not present

## 2023-02-21 ENCOUNTER — Other Ambulatory Visit: Payer: Self-pay | Admitting: Pharmacist

## 2023-02-21 ENCOUNTER — Other Ambulatory Visit: Payer: Self-pay | Admitting: Pharmacy Technician

## 2023-02-21 NOTE — Progress Notes (Signed)
Pt due for Q6M Leqvio injection with next one on 03/26/23. Injections being moved from [facility] to [facility] infusion center, pt aware he will be contacted for next injection.

## 2023-02-28 DIAGNOSIS — C44319 Basal cell carcinoma of skin of other parts of face: Secondary | ICD-10-CM | POA: Diagnosis not present

## 2023-03-07 ENCOUNTER — Telehealth: Payer: Self-pay | Admitting: Pharmacist

## 2023-03-07 ENCOUNTER — Telehealth: Payer: Self-pay

## 2023-03-07 NOTE — Telephone Encounter (Signed)
Please reach out to pt to schedule injection, he called in to our office earlier asking about this, thanks.

## 2023-03-07 NOTE — Telephone Encounter (Signed)
Auth Submission: NO AUTH NEEDED Site of care: Site of care: [facility] Payer: Medicare and AARP Supplement Medication & CPT/J Code(s) submitted: Leqvio (Inclisiran) [id number] Auth type: Buy/Bill Units/visits requested: 284mg  x 1  Approval from: 03/07/2023 to 09/18/2023

## 2023-03-07 NOTE — Telephone Encounter (Signed)
Patient is calling about is calling about his leqvio injection, she states he still hasn't be contact about rescheduling his appt.  He states he is due to have the injection in two weeks.

## 2023-03-12 NOTE — Telephone Encounter (Signed)
Megan, Patient has been scheduled for 03/27/23.  Thanks Selena Batten

## 2023-03-16 ENCOUNTER — Other Ambulatory Visit: Payer: Self-pay | Admitting: Interventional Cardiology

## 2023-03-26 ENCOUNTER — Encounter (HOSPITAL_COMMUNITY): Payer: Medicare Other

## 2023-03-27 ENCOUNTER — Ambulatory Visit: Payer: Medicare Other

## 2023-03-27 VITALS — BP 155/89 | HR 65 | Temp 97.7°F | Resp 18 | Ht 64.0 in | Wt 176.8 lb

## 2023-03-27 DIAGNOSIS — E7849 Other hyperlipidemia: Secondary | ICD-10-CM

## 2023-03-27 MED ORDER — INCLISIRAN SODIUM 284 MG/1.5ML ~~LOC~~ SOSY
284.0000 mg | PREFILLED_SYRINGE | Freq: Once | SUBCUTANEOUS | Status: AC
  Administered 2023-03-27: 284 mg via SUBCUTANEOUS
  Filled 2023-03-27: qty 1.5

## 2023-03-27 NOTE — Patient Instructions (Signed)
Inclisiran Injection What is this medication? INCLISIRAN (in kli SIR an) treats high cholesterol. It works by decreasing bad cholesterol (such as LDL) in your blood. Changes to diet and exercise are often combined with this medication. This medicine may be used for other purposes; ask your health care provider or pharmacist if you have questions. COMMON BRAND NAME(S): LEQVIO What should I tell my care team before I take this medication? They need to know if you have any of these conditions: An unusual or allergic reaction to inclisiran, other medications, foods, dyes, or preservatives Pregnant or trying to get pregnant Breast-feeding How should I use this medication? This medication is injected under the skin. It is given by your care team in a hospital or clinic setting. Talk to your care team about the use of this medication in children. Special care may be needed. Overdosage: If you think you have taken too much of this medicine contact a poison control center or emergency room at once. NOTE: This medicine is only for you. Do not share this medicine with others. What if I miss a dose? Keep appointments for follow-up doses. It is important not to miss your dose. Call your care team if you are unable to keep an appointment. What may interact with this medication? Interactions are not expected. This list may not describe all possible interactions. Give your health care provider a list of all the medicines, herbs, non-prescription drugs, or dietary supplements you use. Also tell them if you smoke, drink alcohol, or use illegal drugs. Some items may interact with your medicine. What should I watch for while using this medication? Visit your care team for regular checks on your progress. Tell your care team if your symptoms do not start to get better or if they get worse. You may need blood work while you are taking this medication. What side effects may I notice from receiving this  medication? Side effects that you should report to your care team as soon as possible: Allergic reactions--skin rash, itching, hives, swelling of the face, lips, tongue, or throat Side effects that usually do not require medical attention (report these to your care team if they continue or are bothersome): Joint pain Pain, redness, or irritation at injection site This list may not describe all possible side effects. Call your doctor for medical advice about side effects. You may report side effects to FDA at 1-800-FDA-1088. Where should I keep my medication? This medication is given in a hospital or clinic. It will not be stored at home. NOTE: This sheet is a summary. It may not cover all possible information. If you have questions about this medicine, talk to your doctor, pharmacist, or health care provider.  2024 Elsevier/Gold Standard (2022-11-12 00:00:00)  

## 2023-03-27 NOTE — Progress Notes (Signed)
Diagnosis: Hyperlipidemia  Provider:  Mannam, Praveen MD  Procedure: Injection  Leqvio (inclisiran), Dose: 284 mg, Site: subcutaneous, Number of injections: 1  Post Care: Patient declined observation  Discharge: Condition: Good, Destination: Home . AVS Declined  Performed by:  Jolyn Deshmukh, RN       

## 2023-04-06 ENCOUNTER — Ambulatory Visit: Payer: Medicare Other | Attending: Cardiology | Admitting: Cardiology

## 2023-04-06 VITALS — BP 152/84 | HR 64 | Ht 65.0 in | Wt 173.8 lb

## 2023-04-06 DIAGNOSIS — I10 Essential (primary) hypertension: Secondary | ICD-10-CM | POA: Diagnosis not present

## 2023-04-06 DIAGNOSIS — G4733 Obstructive sleep apnea (adult) (pediatric): Secondary | ICD-10-CM | POA: Diagnosis not present

## 2023-04-06 NOTE — Progress Notes (Signed)
Date:  04/06/2023   ID:  Randy Hudson, DOB 06/21/1946, MRN [id number] The patient was identified using 2 identifiers. PCP:  Sigmund Hazel, MD   [facility] Providers Cardiologist:  Lance Muss, MD     Evaluation Performed:  Follow-Up Visit  Chief Complaint: OSA  History of Present Illness:    Randy Hudson is a 77 y.o. male with a history of ASCHD, hypertension and hyperlipidemia who was referred for sleep study.  He says that he told Dr. Eldridge Dace that he thought he might had OSA.  His wife would notice that he stops breathing during sleep and he snores a lot.  He also was having nonrestorative sleep and excessive daytime sleepiness.  He underwent PSG on 06/15/2021 showing severe obstructive sleep apnea with an AHI of 55.2/h and no significant central events.  O2 saturations dropped as low as 86%.  He underwent CPAP titration but due to ongoing events was placed on auto BiPAP with IPAP max 20 cm H2O, EPAP min 5 cm H2O and pressure support 4 cm H2O.    He is doing well with his PAP device and thinks that he has gotten used to it.  He tolerates the mask and feels the pressure is adequate.  Since going on PAP he feels rested in the am and has no significant daytime sleepiness.  He denies any significant mouth or nasal dryness or nasal congestion.  He does not think that he snores.    Past Medical History:  Diagnosis Date   Hyperlipidemia    Hypertension    Past Surgical History:  Procedure Laterality Date   CORONARY STENT INTERVENTION N/A 04/12/2021   Procedure: CORONARY STENT INTERVENTION;  Surgeon: Corky Crafts, MD;  Location: [facility] INVASIVE CV LAB;  Service: Cardiovascular;  Laterality: N/A;   LEFT HEART CATH AND CORONARY ANGIOGRAPHY N/A 04/12/2021   Procedure: LEFT HEART CATH AND CORONARY ANGIOGRAPHY;  Surgeon: Corky Crafts, MD;  Location: [facility] INVASIVE CV LAB;  Service: Cardiovascular;  Laterality: N/A;     Current Meds  Medication Sig   Aflibercept (EYLEA IO)  Inject 1 Dose into the eye every 3 (three) months.   atorvastatin (LIPITOR) 80 MG tablet Take 80 mg by mouth in the morning.   carboxymethylcellul-glycerin (LUBRICANT DROPS/DUAL-ACTION) 0.5-0.9 % ophthalmic solution Place 1 drop into both eyes 3 (three) times daily as needed (dry/irritated eyes.).   Cholecalciferol (VITAMIN D) 50 MCG (2000 UT) tablet Take 2,000 Units by mouth in the morning.   clopidogrel (PLAVIX) 75 MG tablet Take 1 tablet (75 mg total) by mouth daily.   inclisiran (LEQVIO) 284 MG/1.5ML SOSY injection Inject 284 mg into the skin as directed. Administered subcutaneously every 6 months   metoprolol succinate (TOPROL-XL) 25 MG 24 hr tablet TAKE 1 TABLET BY MOUTH EVERY DAY WITH OR IMMEDIATELY FOLLOWING A MEAL   Multiple Vitamins-Minerals (PRESERVISION AREDS 2+MULTI VIT PO) Take 1 capsule by mouth 2 (two) times daily.   nitroGLYCERIN (NITROSTAT) 0.4 MG SL tablet Place 1 tablet (0.4 mg total) under the tongue every 5 (five) minutes as needed for chest pain.   tobramycin (TOBREX) 0.3 % ophthalmic solution Place 1 drop into the right eye as directed. Instill 1 drop into right eye 4 times daily the day before, the day of & the day following eylea intravitreal injection   triamterene-hydrochlorothiazide (DYAZIDE) 37.5-25 MG capsule Take 1 capsule by mouth every morning.     Allergies:   Patient has no known allergies.   Social History  Tobacco Use   Smoking status: Never   Smokeless tobacco: Never     Family Hx: The patient's family history includes Colon cancer in his father; Heart disease in his father.  ROS:   Please see the history of present illness.     All other systems reviewed and are negative.   Prior CV studies:   The following studies were reviewed today:  PSG and CPAP titration, PAP download compliance  Labs/Other Tests and Data Reviewed:    EKG:  No ECG reviewed.  Recent Labs: No results found for requested labs within last 365 days.   Recent Lipid  Panel No results found for: "CHOL", "TRIG", "HDL", "CHOLHDL", "LDLCALC", "LDLDIRECT"  Wt Readings from Last 3 Encounters:  04/06/23 173 lb 12.8 oz (78.8 kg)  03/27/23 176 lb 12.8 oz (80.2 kg)  02/05/23 169 lb 6.4 oz (76.8 kg)     Risk Assessment/Calculations:      STOP-Bang Score:         Objective:    Vital Signs:  Ht 5\' 5"  (1.651 m)   Wt 173 lb 12.8 oz (78.8 kg)   BMI 28.92 kg/m    GEN: Well nourished, well developed in no acute distress HEENT: Normal NECK: No JVD; No carotid bruits LYMPHATICS: No lymphadenopathy CARDIAC:RRR, no murmurs, rubs, gallops RESPIRATORY:  Clear to auscultation without rales, wheezing or rhonchi  ABDOMEN: Soft, non-tender, non-distended MUSCULOSKELETAL:  No edema; No deformity  SKIN: Warm and dry NEUROLOGIC:  Alert and oriented x 3 PSYCHIATRIC:  Normal affect  ASSESSMENT & PLAN:    OSA - The patient is tolerating PAP therapy well without any problems. The patient has been using and benefiting from PAP use and will continue to benefit from therapy.  -I will get a download from his DME -continue current settings  HTN -BP borderline controlled on exam today -Continue prescription drug management with Toprol XL 25mg  daily and Dyazide 37.5-25mg  daily with PRN refills  Medication Adjustments/Labs and Tests Ordered: Current medicines are reviewed at length with the patient today.  Concerns regarding medicines are outlined above.   Tests Ordered: No orders of the defined types were placed in this encounter.   Medication Changes: No orders of the defined types were placed in this encounter.   Follow Up:  Virtual Visit  in 1 year(s)  Signed, Armanda Magic, MD  04/06/2023 9:21 AM    [facility]

## 2023-04-06 NOTE — Patient Instructions (Signed)

## 2023-04-24 ENCOUNTER — Encounter: Payer: Self-pay | Admitting: Cardiology

## 2023-05-04 ENCOUNTER — Ambulatory Visit: Payer: Medicare Other | Admitting: Interventional Cardiology

## 2023-05-09 DIAGNOSIS — H2513 Age-related nuclear cataract, bilateral: Secondary | ICD-10-CM | POA: Diagnosis not present

## 2023-05-09 DIAGNOSIS — H34831 Tributary (branch) retinal vein occlusion, right eye, with macular edema: Secondary | ICD-10-CM | POA: Diagnosis not present

## 2023-05-09 DIAGNOSIS — H43813 Vitreous degeneration, bilateral: Secondary | ICD-10-CM | POA: Diagnosis not present

## 2023-05-09 DIAGNOSIS — H353132 Nonexudative age-related macular degeneration, bilateral, intermediate dry stage: Secondary | ICD-10-CM | POA: Diagnosis not present

## 2023-06-24 ENCOUNTER — Encounter: Payer: Self-pay | Admitting: Cardiology

## 2023-06-25 ENCOUNTER — Telehealth: Payer: Self-pay | Admitting: Cardiology

## 2023-06-25 MED ORDER — CLOPIDOGREL BISULFATE 75 MG PO TABS: 75.0000 mg | ORAL_TABLET | Freq: Every day | ORAL | 2 refills | Status: DC

## 2023-06-25 NOTE — Telephone Encounter (Signed)
Pt's medication was sent to pt's pharmacy as requested. Confirmation received.  °

## 2023-06-25 NOTE — Telephone Encounter (Signed)
*  STAT* If patient is at the pharmacy, call can be transferred to refill team.   1. Which medications need to be refilled? (please list name of each medication and dose if known)   clopidogrel (PLAVIX) 75 MG tablet     4. Which pharmacy/location (including street and city if local pharmacy) is medication to be sent to? [id number] - [place], [place] - [address]     5. Do they need a 30 day or 90 day supply? 90    Pt asked to switch to Dr. Mayford Knife as primary Cardiologist

## 2023-06-26 ENCOUNTER — Ambulatory Visit
Admission: RE | Admit: 2023-06-26 | Discharge: 2023-06-26 | Disposition: A | Discharge status: Home / Self Care | Payer: Medicare Other | Source: Ambulatory Visit | Attending: Pain Medicine | Admitting: Pain Medicine

## 2023-06-26 ENCOUNTER — Other Ambulatory Visit: Payer: Self-pay | Admitting: Pain Medicine

## 2023-06-26 DIAGNOSIS — M545 Low back pain, unspecified: Secondary | ICD-10-CM | POA: Diagnosis not present

## 2023-07-09 DIAGNOSIS — M479 Spondylosis, unspecified: Secondary | ICD-10-CM | POA: Diagnosis not present

## 2023-07-09 DIAGNOSIS — S39012A Strain of muscle, fascia and tendon of lower back, initial encounter: Secondary | ICD-10-CM | POA: Diagnosis not present

## 2023-07-10 DIAGNOSIS — M545 Low back pain, unspecified: Secondary | ICD-10-CM | POA: Diagnosis not present

## 2023-07-10 DIAGNOSIS — M5136 Other intervertebral disc degeneration, lumbar region with discogenic back pain only: Secondary | ICD-10-CM | POA: Diagnosis not present

## 2023-07-11 DIAGNOSIS — M479 Spondylosis, unspecified: Secondary | ICD-10-CM | POA: Diagnosis not present

## 2023-07-11 DIAGNOSIS — Z23 Encounter for immunization: Secondary | ICD-10-CM | POA: Diagnosis not present

## 2023-07-11 DIAGNOSIS — Z6829 Body mass index (BMI) 29.0-29.9, adult: Secondary | ICD-10-CM | POA: Diagnosis not present

## 2023-07-11 DIAGNOSIS — E663 Overweight: Secondary | ICD-10-CM | POA: Diagnosis not present

## 2023-07-11 DIAGNOSIS — E7800 Pure hypercholesterolemia, unspecified: Secondary | ICD-10-CM | POA: Diagnosis not present

## 2023-07-11 DIAGNOSIS — I10 Essential (primary) hypertension: Secondary | ICD-10-CM | POA: Diagnosis not present

## 2023-07-12 DIAGNOSIS — M545 Low back pain, unspecified: Secondary | ICD-10-CM | POA: Diagnosis not present

## 2023-07-12 DIAGNOSIS — M5136 Other intervertebral disc degeneration, lumbar region with discogenic back pain only: Secondary | ICD-10-CM | POA: Diagnosis not present

## 2023-07-12 NOTE — Telephone Encounter (Signed)
PT came in this morning to drop off paper work to be filled out by Dr Mayford Knife.Randy Hudson

## 2023-07-17 ENCOUNTER — Telehealth: Payer: Self-pay

## 2023-07-17 DIAGNOSIS — M545 Low back pain, unspecified: Secondary | ICD-10-CM | POA: Diagnosis not present

## 2023-07-17 DIAGNOSIS — M5136 Other intervertebral disc degeneration, lumbar region with discogenic back pain only: Secondary | ICD-10-CM | POA: Diagnosis not present

## 2023-07-17 NOTE — Telephone Encounter (Signed)
Call to patient to explain that Dr. Mayford Knife reviewed the forms patient submitted. Forms are from [employer] regarding infusion reactions to [employer] administration and are addressed to Sigmund Hazel, MD, patient's PCP. Dr. Mayford Knife was not treating patient at the time of Leqvio administration. Reviewed forms with Pharm D and they state infusion center where Leqvio was administered may be the best placed to get forms filled out as they should have infusion records. Explained to patient that infusion center is probably who needs to fill out these forms for him. Asked patient if he would like to have these forms mailed back to him or left at front desk for pick up. Patient states he does not need the forms given back. He also states he does not believe he had any infusion reactions, just arm soreness after an infusion. He states the form asks about his sleep apnea though patient states the sleep apnea predated any Leqvio administration.

## 2023-07-19 DIAGNOSIS — M545 Low back pain, unspecified: Secondary | ICD-10-CM | POA: Diagnosis not present

## 2023-07-19 DIAGNOSIS — M5136 Other intervertebral disc degeneration, lumbar region with discogenic back pain only: Secondary | ICD-10-CM | POA: Diagnosis not present

## 2023-07-24 DIAGNOSIS — M5136 Other intervertebral disc degeneration, lumbar region with discogenic back pain only: Secondary | ICD-10-CM | POA: Diagnosis not present

## 2023-07-24 DIAGNOSIS — M545 Low back pain, unspecified: Secondary | ICD-10-CM | POA: Diagnosis not present

## 2023-07-26 DIAGNOSIS — M5136 Other intervertebral disc degeneration, lumbar region with discogenic back pain only: Secondary | ICD-10-CM | POA: Diagnosis not present

## 2023-07-26 DIAGNOSIS — M545 Low back pain, unspecified: Secondary | ICD-10-CM | POA: Diagnosis not present

## 2023-07-31 DIAGNOSIS — M545 Low back pain, unspecified: Secondary | ICD-10-CM | POA: Diagnosis not present

## 2023-07-31 DIAGNOSIS — M5136 Other intervertebral disc degeneration, lumbar region with discogenic back pain only: Secondary | ICD-10-CM | POA: Diagnosis not present

## 2023-08-02 DIAGNOSIS — M545 Low back pain, unspecified: Secondary | ICD-10-CM | POA: Diagnosis not present

## 2023-08-02 DIAGNOSIS — M5136 Other intervertebral disc degeneration, lumbar region with discogenic back pain only: Secondary | ICD-10-CM | POA: Diagnosis not present

## 2023-08-07 DIAGNOSIS — Z23 Encounter for immunization: Secondary | ICD-10-CM | POA: Diagnosis not present

## 2023-08-29 DIAGNOSIS — H2513 Age-related nuclear cataract, bilateral: Secondary | ICD-10-CM | POA: Diagnosis not present

## 2023-08-29 DIAGNOSIS — H43813 Vitreous degeneration, bilateral: Secondary | ICD-10-CM | POA: Diagnosis not present

## 2023-08-29 DIAGNOSIS — H34831 Tributary (branch) retinal vein occlusion, right eye, with macular edema: Secondary | ICD-10-CM | POA: Diagnosis not present

## 2023-08-29 DIAGNOSIS — H353132 Nonexudative age-related macular degeneration, bilateral, intermediate dry stage: Secondary | ICD-10-CM | POA: Diagnosis not present

## 2023-09-27 ENCOUNTER — Telehealth: Payer: Self-pay

## 2023-09-27 ENCOUNTER — Ambulatory Visit (INDEPENDENT_AMBULATORY_CARE_PROVIDER_SITE_OTHER): Payer: Medicare Other

## 2023-09-27 VITALS — BP 151/80 | HR 66 | Temp 97.9°F | Ht 65.0 in | Wt 178.4 lb

## 2023-09-27 DIAGNOSIS — E7849 Other hyperlipidemia: Secondary | ICD-10-CM

## 2023-09-27 MED ORDER — INCLISIRAN SODIUM 284 MG/1.5ML ~~LOC~~ SOSY
284.0000 mg | PREFILLED_SYRINGE | Freq: Once | SUBCUTANEOUS | Status: AC
  Administered 2023-09-27: 284 mg via SUBCUTANEOUS
  Filled 2023-09-27: qty 1.5

## 2023-09-27 NOTE — Progress Notes (Signed)
 Diagnosis: Hyperlipidemia  Provider:  Chilton Greathouse MD  Procedure: Injection  Leqvio (inclisiran), Dose: 284 mg, Site: subcutaneous, Number of injections: 1  Injection Site(s): Left arm  Post Care: Patient declined observation  Discharge: Condition: Good, Destination: Home . AVS Declined  Performed by:  Wyvonne Lenz, RN

## 2023-09-27 NOTE — Telephone Encounter (Signed)
 Auth Submission: NO AUTH NEEDED Site of care: Site of care: [facility] Payer: Medicare and AARP Supplement Medication & CPT/J Code(s) submitted: Leqvio  (Inclisiran) [id number] Auth type: Buy/Bill Units/visits requested: 284mg  x 2 doses   Approval from: 03/07/2023 to 10/18/24

## 2023-10-03 ENCOUNTER — Encounter: Payer: Self-pay | Admitting: Cardiology

## 2023-10-03 MED ORDER — CLOPIDOGREL BISULFATE 75 MG PO TABS: 75.0000 mg | ORAL_TABLET | Freq: Every day | ORAL | 1 refills | Status: DC

## 2023-10-06 DIAGNOSIS — M79672 Pain in left foot: Secondary | ICD-10-CM | POA: Diagnosis not present

## 2023-10-12 DIAGNOSIS — M109 Gout, unspecified: Secondary | ICD-10-CM | POA: Diagnosis not present

## 2023-10-12 DIAGNOSIS — Z6827 Body mass index (BMI) 27.0-27.9, adult: Secondary | ICD-10-CM | POA: Diagnosis not present

## 2023-11-07 ENCOUNTER — Encounter: Payer: Self-pay | Admitting: Cardiology

## 2023-11-15 DIAGNOSIS — M479 Spondylosis, unspecified: Secondary | ICD-10-CM | POA: Diagnosis not present

## 2023-11-15 DIAGNOSIS — Z6829 Body mass index (BMI) 29.0-29.9, adult: Secondary | ICD-10-CM | POA: Diagnosis not present

## 2023-11-15 DIAGNOSIS — M109 Gout, unspecified: Secondary | ICD-10-CM | POA: Diagnosis not present

## 2023-11-20 DIAGNOSIS — M479 Spondylosis, unspecified: Secondary | ICD-10-CM | POA: Diagnosis not present

## 2023-12-19 DIAGNOSIS — H2513 Age-related nuclear cataract, bilateral: Secondary | ICD-10-CM | POA: Diagnosis not present

## 2023-12-19 DIAGNOSIS — H353132 Nonexudative age-related macular degeneration, bilateral, intermediate dry stage: Secondary | ICD-10-CM | POA: Diagnosis not present

## 2023-12-19 DIAGNOSIS — H43813 Vitreous degeneration, bilateral: Secondary | ICD-10-CM | POA: Diagnosis not present

## 2023-12-19 DIAGNOSIS — H34831 Tributary (branch) retinal vein occlusion, right eye, with macular edema: Secondary | ICD-10-CM | POA: Diagnosis not present

## 2023-12-31 DIAGNOSIS — M51369 Other intervertebral disc degeneration, lumbar region without mention of lumbar back pain or lower extremity pain: Secondary | ICD-10-CM | POA: Diagnosis not present

## 2023-12-31 DIAGNOSIS — M4800 Spinal stenosis, site unspecified: Secondary | ICD-10-CM | POA: Diagnosis not present

## 2023-12-31 DIAGNOSIS — M5459 Other low back pain: Secondary | ICD-10-CM | POA: Diagnosis not present

## 2024-01-02 DIAGNOSIS — M5459 Other low back pain: Secondary | ICD-10-CM | POA: Diagnosis not present

## 2024-01-14 DIAGNOSIS — M47819 Spondylosis without myelopathy or radiculopathy, site unspecified: Secondary | ICD-10-CM | POA: Diagnosis not present

## 2024-01-14 DIAGNOSIS — M51362 Other intervertebral disc degeneration, lumbar region with discogenic back pain and lower extremity pain: Secondary | ICD-10-CM | POA: Diagnosis not present

## 2024-01-15 DIAGNOSIS — H35033 Hypertensive retinopathy, bilateral: Secondary | ICD-10-CM | POA: Diagnosis not present

## 2024-01-15 DIAGNOSIS — H353123 Nonexudative age-related macular degeneration, left eye, advanced atrophic without subfoveal involvement: Secondary | ICD-10-CM | POA: Diagnosis not present

## 2024-01-15 DIAGNOSIS — H2513 Age-related nuclear cataract, bilateral: Secondary | ICD-10-CM | POA: Diagnosis not present

## 2024-01-15 DIAGNOSIS — H353210 Exudative age-related macular degeneration, right eye, stage unspecified: Secondary | ICD-10-CM | POA: Diagnosis not present

## 2024-01-17 DIAGNOSIS — I2510 Atherosclerotic heart disease of native coronary artery without angina pectoris: Secondary | ICD-10-CM | POA: Diagnosis not present

## 2024-01-17 DIAGNOSIS — E7800 Pure hypercholesterolemia, unspecified: Secondary | ICD-10-CM | POA: Diagnosis not present

## 2024-01-17 DIAGNOSIS — I10 Essential (primary) hypertension: Secondary | ICD-10-CM | POA: Diagnosis not present

## 2024-01-17 DIAGNOSIS — Z0000 Encounter for general adult medical examination without abnormal findings: Secondary | ICD-10-CM | POA: Diagnosis not present

## 2024-01-17 DIAGNOSIS — Z6829 Body mass index (BMI) 29.0-29.9, adult: Secondary | ICD-10-CM | POA: Diagnosis not present

## 2024-01-17 DIAGNOSIS — M479 Spondylosis, unspecified: Secondary | ICD-10-CM | POA: Diagnosis not present

## 2024-01-17 DIAGNOSIS — Z1331 Encounter for screening for depression: Secondary | ICD-10-CM | POA: Diagnosis not present

## 2024-02-26 ENCOUNTER — Other Ambulatory Visit: Payer: Self-pay | Admitting: Interventional Cardiology

## 2024-03-26 ENCOUNTER — Other Ambulatory Visit: Payer: Self-pay | Admitting: Cardiology

## 2024-03-27 ENCOUNTER — Ambulatory Visit: Payer: Medicare Other

## 2024-03-27 VITALS — BP 129/77 | HR 67 | Temp 98.6°F | Resp 20 | Ht 65.0 in | Wt 183.6 lb

## 2024-03-27 DIAGNOSIS — E7849 Other hyperlipidemia: Secondary | ICD-10-CM | POA: Diagnosis not present

## 2024-03-27 MED ORDER — INCLISIRAN SODIUM 284 MG/1.5ML ~~LOC~~ SOSY
284.0000 mg | PREFILLED_SYRINGE | Freq: Once | SUBCUTANEOUS | Status: AC
  Administered 2024-03-27: 284 mg via SUBCUTANEOUS
  Filled 2024-03-27: qty 1.5

## 2024-03-27 NOTE — Progress Notes (Signed)
 Diagnosis: Hyperlipidemia  Provider:  Chilton Greathouse MD  Procedure: Injection  Leqvio (inclisiran), Dose: 284 mg, Site: subcutaneous, Number of injections: 1  Injection Site(s): Right arm  Post Care: Patient declined observation  Discharge: Condition: Stable, Destination: Home . AVS Declined  Performed by:  Wyvonne Lenz, RN

## 2024-04-09 DIAGNOSIS — H33322 Round hole, left eye: Secondary | ICD-10-CM | POA: Diagnosis not present

## 2024-04-09 DIAGNOSIS — H353132 Nonexudative age-related macular degeneration, bilateral, intermediate dry stage: Secondary | ICD-10-CM | POA: Diagnosis not present

## 2024-04-09 DIAGNOSIS — H43813 Vitreous degeneration, bilateral: Secondary | ICD-10-CM | POA: Diagnosis not present

## 2024-04-09 DIAGNOSIS — H34831 Tributary (branch) retinal vein occlusion, right eye, with macular edema: Secondary | ICD-10-CM | POA: Diagnosis not present

## 2024-04-09 DIAGNOSIS — H2513 Age-related nuclear cataract, bilateral: Secondary | ICD-10-CM | POA: Diagnosis not present

## 2024-04-09 DIAGNOSIS — H33192 Other retinoschisis and retinal cysts, left eye: Secondary | ICD-10-CM | POA: Diagnosis not present

## 2024-04-14 ENCOUNTER — Encounter: Payer: Self-pay | Admitting: Cardiology

## 2024-04-14 ENCOUNTER — Ambulatory Visit: Attending: Cardiology | Admitting: Cardiology

## 2024-04-14 VITALS — BP 154/90 | HR 71 | Ht 65.0 in | Wt 180.6 lb

## 2024-04-14 DIAGNOSIS — G4733 Obstructive sleep apnea (adult) (pediatric): Secondary | ICD-10-CM | POA: Insufficient documentation

## 2024-04-14 DIAGNOSIS — I10 Essential (primary) hypertension: Secondary | ICD-10-CM | POA: Diagnosis not present

## 2024-04-14 DIAGNOSIS — E7849 Other hyperlipidemia: Secondary | ICD-10-CM | POA: Diagnosis not present

## 2024-04-14 DIAGNOSIS — Z79899 Other long term (current) drug therapy: Secondary | ICD-10-CM | POA: Insufficient documentation

## 2024-04-14 LAB — LIPID PANEL

## 2024-04-14 NOTE — Progress Notes (Signed)
 Date:  04/14/2024   ID:  Randy Hudson, DOB 08/26/1946, MRN [id number] The patient was identified using 2 identifiers. PCP:  Cleotilde Planas, MD   [facility] Providers Cardiologist:  Randy Reek, MD     Evaluation Performed:  Follow-Up Visit  Chief Complaint: OSA  History of Present Illness:    Randy Hudson is a 78 y.o. male with a history of ASCHD, hypertension and hyperlipidemia who was referred for sleep study.  His wife would notice that he stops breathing during sleep and he snores a lot.  He also was having nonrestorative sleep and excessive daytime sleepiness.  He underwent PSG on 06/15/2021 showing severe obstructive sleep apnea with an AHI of 55.2/h and no significant central events.  O2 saturations dropped as low as 86%.  He underwent CPAP titration but due to ongoing events was placed on auto BiPAP with IPAP max 20 cm H2O, EPAP min 5 cm H2O and pressure support 4 cm H2O.    He denies any chest pain or pressure, SOB, DOE, , dizziness, palpitations or syncope.  He has a lot of chronic back pain. He occasionally has some mild LE edema.   He is doing well with his PAP device and thinks that he has gotten used to it.  He tolerates the mask and feels the pressure is adequate.  Since going on PAP he feels rested in the am if he sleeps well the night before.  He notices that by lunch his energy level has decreased.  Rarely he will nap in the afternoon. He gets at least 8 hours of sleep nightly but it is disrupted by back pain at night.  He denies any significant mouth or nasal dryness or nasal congestion.  He does not think that he snores.     Past Medical History:  Diagnosis Date   Hyperlipidemia    Hypertension    Past Surgical History:  Procedure Laterality Date   CORONARY STENT INTERVENTION N/A 04/12/2021   Procedure: CORONARY STENT INTERVENTION;  Surgeon: Hudson Randy RAMAN, MD;  Location: [facility] INVASIVE CV LAB;  Service: Cardiovascular;  Laterality: N/A;   LEFT HEART  CATH AND CORONARY ANGIOGRAPHY N/A 04/12/2021   Procedure: LEFT HEART CATH AND CORONARY ANGIOGRAPHY;  Surgeon: Hudson Randy RAMAN, MD;  Location: [facility] INVASIVE CV LAB;  Service: Cardiovascular;  Laterality: N/A;     Current Meds  Medication Sig   Aflibercept  (EYLEA  IO) Inject 1 Dose into the eye every 3 (three) months.   atorvastatin  (LIPITOR ) 80 MG tablet Take 80 mg by mouth in the morning.   carboxymethylcellul-glycerin (LUBRICANT DROPS/DUAL-ACTION) 0.5-0.9 % ophthalmic solution Place 1 drop into both eyes 3 (three) times daily as needed (dry/irritated eyes.).   Cholecalciferol  (VITAMIN D ) 50 MCG (2000 UT) tablet Take 2,000 Units by mouth in the morning.   clopidogrel  (PLAVIX ) 75 MG tablet TAKE 1 TABLET BY MOUTH EVERY DAY   inclisiran (LEQVIO ) 284 MG/1.5ML SOSY injection Inject 284 mg into the skin as directed. Administered subcutaneously every 6 months   metoprolol  succinate (TOPROL -XL) 25 MG 24 hr tablet TAKE 1 TABLET BY MOUTH EVERY DAY WITH OR IMMEDIATELY FOLLOWING A MEAL   Multiple Vitamins-Minerals (PRESERVISION AREDS 2+MULTI VIT PO) Take 1 capsule by mouth 2 (two) times daily.   nitroGLYCERIN  (NITROSTAT ) 0.4 MG SL tablet Place 1 tablet (0.4 mg total) under the tongue every 5 (five) minutes as needed for chest pain.   tobramycin  (TOBREX ) 0.3 % ophthalmic solution Place 1 drop into the right eye as  directed. Instill 1 drop into right eye 4 times daily the day before, the day of & the day following eylea  intravitreal injection   triamterene -hydrochlorothiazide  (DYAZIDE ) 37.5-25 MG capsule Take 1 capsule by mouth every morning.     Allergies:   Patient has no known allergies.   Social History   Tobacco Use   Smoking status: Never   Smokeless tobacco: Never     Family Hx: The patient's family history includes Colon cancer in his father; Heart disease in his father.  ROS:   Please see the history of present illness.     All other systems reviewed and are negative.   Prior CV  studies:   The following studies were reviewed today:  PSG and CPAP titration, PAP download compliance  Labs/Other Tests and Data Reviewed:    EKG:  No ECG reviewed.  Recent Labs: No results found for requested labs within last 365 days.   Recent Lipid Panel No results found for: CHOL, TRIG, HDL, CHOLHDL, LDLCALC, LDLDIRECT  Wt Readings from Last 3 Encounters:  04/14/24 180 lb 9.6 oz (81.9 kg)  03/27/24 183 lb 9.6 oz (83.3 kg)  09/27/23 178 lb 6.4 oz (80.9 kg)     Risk Assessment/Calculations:      STOP-Bang Score:         Objective:    Vital Signs:  BP (!) 154/90   Pulse 71   Ht 5' 5 (1.651 m)   Wt 180 lb 9.6 oz (81.9 kg)   SpO2 97%   BMI 30.05 kg/m    GEN: Well nourished, well developed in no acute distress HEENT: Normal NECK: No JVD; No carotid bruits LYMPHATICS: No lymphadenopathy CARDIAC:RRR, no murmurs, rubs, gallops RESPIRATORY:  Clear to auscultation without rales, wheezing or rhonchi  ABDOMEN: Soft, non-tender, non-distended MUSCULOSKELETAL:  No edema; No deformity  SKIN: Warm and dry NEUROLOGIC:  Alert and oriented x 3 PSYCHIATRIC:  Normal affect  ASSESSMENT & PLAN:    OSA - The patient is tolerating PAP therapy well without any problems. The patient has been using and benefiting from PAP use and will continue to benefit from therapy.  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  Concerns regarding medicines are outlined above.   Tests Ordered: No orders of the defined types were placed in this encounter.   Medication Changes: No orders of the defined types were placed in this encounter.   Follow Up:1 year  Signed, Wilbert Bihari, MD  04/14/2024 9:11 AM    [facility]

## 2024-04-14 NOTE — Patient Instructions (Addendum)
 Medication Instructions:  Your physician recommends that you continue on your current medications as directed. Please refer to the Current Medication list given to you today.  *If you need a refill on your cardiac medications before your next appointment, please call your pharmacy*  Lab Work: Please complete a FASTING lipid and an ALT today in our first floor lab before you leave.   If you have labs (blood work) drawn today and your tests are completely normal, you will receive your results only by: MyChart Message (if you have MyChart) OR A paper copy in the mail If you have any lab test that is abnormal or we need to change your treatment, we will call you to review the results.  Testing/Procedures: None.  Follow-Up: At [facility], you and your health needs are our priority.  As part of our continuing mission to provide you with exceptional heart care, our providers are all part of one team.  This team includes your primary Cardiologist (physician) and Advanced Practice Providers or APPs (Physician Assistants and Nurse Practitioners) who all work together to provide you with the care you need, when you need it.  Your next appointment will be a MyChart/Virtual appointment:   1 year(s)  Provider:   Dr. Wilbert Bihari, MD   Please track your blood pressure twice a day for one week, checking your blood pressure at lunch and at dinner. Write down your readings and then send them over MyChart, drop them off at the front desk, or call our office # [phone]) and give them to our operators.

## 2024-04-15 ENCOUNTER — Ambulatory Visit: Payer: Self-pay | Admitting: Cardiology

## 2024-04-15 LAB — LIPID PANEL
Cholesterol, Total: 102 mg/dL (ref 100–199)
HDL: 49 mg/dL (ref >39)
LDL CALC COMMENT:: 2.1 ratio (ref 0.0–5.0)
LDL Chol Calc (NIH): 31 mg/dL (ref 0–99)
Triglycerides: 126 mg/dL (ref 0–149)
VLDL Cholesterol Cal: 22 mg/dL (ref 5–40)

## 2024-04-15 LAB — ALT: ALT: 39 IU/L (ref 0–44)

## 2024-04-22 ENCOUNTER — Encounter: Payer: Self-pay | Admitting: Cardiology

## 2024-05-24 ENCOUNTER — Other Ambulatory Visit: Payer: Self-pay | Admitting: Interventional Cardiology

## 2024-06-17 DIAGNOSIS — H353122 Nonexudative age-related macular degeneration, left eye, intermediate dry stage: Secondary | ICD-10-CM | POA: Diagnosis not present

## 2024-06-17 DIAGNOSIS — H18413 Arcus senilis, bilateral: Secondary | ICD-10-CM | POA: Diagnosis not present

## 2024-06-17 DIAGNOSIS — H2511 Age-related nuclear cataract, right eye: Secondary | ICD-10-CM | POA: Diagnosis not present

## 2024-06-17 DIAGNOSIS — H2513 Age-related nuclear cataract, bilateral: Secondary | ICD-10-CM | POA: Diagnosis not present

## 2024-06-17 DIAGNOSIS — H353211 Exudative age-related macular degeneration, right eye, with active choroidal neovascularization: Secondary | ICD-10-CM | POA: Diagnosis not present

## 2024-06-17 DIAGNOSIS — H353132 Nonexudative age-related macular degeneration, bilateral, intermediate dry stage: Secondary | ICD-10-CM | POA: Diagnosis not present

## 2024-06-17 DIAGNOSIS — H25043 Posterior subcapsular polar age-related cataract, bilateral: Secondary | ICD-10-CM | POA: Diagnosis not present

## 2024-06-20 ENCOUNTER — Other Ambulatory Visit: Payer: Self-pay | Admitting: Cardiology

## 2024-07-22 DIAGNOSIS — I10 Essential (primary) hypertension: Secondary | ICD-10-CM | POA: Diagnosis not present

## 2024-07-22 DIAGNOSIS — E7800 Pure hypercholesterolemia, unspecified: Secondary | ICD-10-CM | POA: Diagnosis not present

## 2024-07-22 DIAGNOSIS — E663 Overweight: Secondary | ICD-10-CM | POA: Diagnosis not present

## 2024-07-22 DIAGNOSIS — Z6829 Body mass index (BMI) 29.0-29.9, adult: Secondary | ICD-10-CM | POA: Diagnosis not present

## 2024-07-22 DIAGNOSIS — Z23 Encounter for immunization: Secondary | ICD-10-CM | POA: Diagnosis not present

## 2024-07-30 DIAGNOSIS — H353132 Nonexudative age-related macular degeneration, bilateral, intermediate dry stage: Secondary | ICD-10-CM | POA: Diagnosis not present

## 2024-07-30 DIAGNOSIS — H43813 Vitreous degeneration, bilateral: Secondary | ICD-10-CM | POA: Diagnosis not present

## 2024-07-30 DIAGNOSIS — H33322 Round hole, left eye: Secondary | ICD-10-CM | POA: Diagnosis not present

## 2024-07-30 DIAGNOSIS — H34831 Tributary (branch) retinal vein occlusion, right eye, with macular edema: Secondary | ICD-10-CM | POA: Diagnosis not present

## 2024-07-30 DIAGNOSIS — H2513 Age-related nuclear cataract, bilateral: Secondary | ICD-10-CM | POA: Diagnosis not present

## 2024-07-30 DIAGNOSIS — H33192 Other retinoschisis and retinal cysts, left eye: Secondary | ICD-10-CM | POA: Diagnosis not present

## 2024-08-25 DIAGNOSIS — H5371 Glare sensitivity: Secondary | ICD-10-CM | POA: Diagnosis not present

## 2024-08-25 DIAGNOSIS — H2511 Age-related nuclear cataract, right eye: Secondary | ICD-10-CM | POA: Diagnosis not present

## 2024-08-25 DIAGNOSIS — H52201 Unspecified astigmatism, right eye: Secondary | ICD-10-CM | POA: Diagnosis not present

## 2024-08-26 DIAGNOSIS — H2512 Age-related nuclear cataract, left eye: Secondary | ICD-10-CM | POA: Diagnosis not present

## 2024-09-26 ENCOUNTER — Telehealth: Payer: Self-pay

## 2024-09-26 NOTE — Telephone Encounter (Signed)
 Auth Submission: NO AUTH NEEDED Site of care: Site of care: [facility] Payer: Medicare A/B with AARP supplement Medication & CPT/J Code(s) submitted: Leqvio  (Inclisiran) J1306 Diagnosis Code:  Route of submission (phone, fax, portal):  Phone # Fax # Auth type: Buy/Bill PB Units/visits requested: 284mg  x 2 doses Reference number:  Approval from: 09/26/24 to 10/18/25

## 2024-09-29 ENCOUNTER — Ambulatory Visit

## 2024-09-29 VITALS — BP 146/79 | HR 72 | Temp 97.8°F | Resp 20 | Ht 65.0 in | Wt 181.0 lb

## 2024-09-29 DIAGNOSIS — E7849 Other hyperlipidemia: Secondary | ICD-10-CM

## 2024-09-29 MED ORDER — INCLISIRAN SODIUM 284 MG/1.5ML ~~LOC~~ SOSY
284.0000 mg | PREFILLED_SYRINGE | Freq: Once | SUBCUTANEOUS | Status: AC
  Administered 2024-09-29: 284 mg via SUBCUTANEOUS

## 2024-09-29 NOTE — Progress Notes (Signed)
 Diagnosis: Hyperlipidemia  Provider:  Chilton Greathouse MD  Procedure: Injection  Leqvio (inclisiran), Dose: 284 mg, Site: subcutaneous, Number of injections: 1  Injection Site(s): Right arm  Post Care:  right arm injection  Discharge: Condition: Good, Destination: Home . AVS Declined  Performed by:  Rico Ala, LPN

## 2025-03-30 ENCOUNTER — Ambulatory Visit
